# Patient Record
Sex: Female | Born: 1985 | Race: White | Hispanic: No | Marital: Married | Smoking: Never smoker
Health system: Southern US, Community
[De-identification: ages and names within clinical notes are randomized; demographics above are authoritative.]

## PROBLEM LIST (undated history)

## (undated) DIAGNOSIS — F4310 Post-traumatic stress disorder, unspecified: Secondary | ICD-10-CM

## (undated) DIAGNOSIS — N946 Dysmenorrhea, unspecified: Secondary | ICD-10-CM

## (undated) DIAGNOSIS — R87619 Unspecified abnormal cytological findings in specimens from cervix uteri: Secondary | ICD-10-CM

## (undated) DIAGNOSIS — F32A Depression, unspecified: Secondary | ICD-10-CM

## (undated) HISTORY — DX: Depression, unspecified: F32.A

## (undated) HISTORY — DX: Dysmenorrhea, unspecified: N94.6

## (undated) HISTORY — DX: Unspecified abnormal cytological findings in specimens from cervix uteri: R87.619

## (undated) HISTORY — DX: Post-traumatic stress disorder, unspecified: F43.10

---

## 2005-11-10 HISTORY — PX: WISDOM TOOTH EXTRACTION: SHX21

## 2006-11-10 DIAGNOSIS — R87619 Unspecified abnormal cytological findings in specimens from cervix uteri: Secondary | ICD-10-CM

## 2006-11-10 HISTORY — PX: COLPOSCOPY: SHX161

## 2006-11-10 HISTORY — DX: Unspecified abnormal cytological findings in specimens from cervix uteri: R87.619

## 2010-07-19 ENCOUNTER — Ambulatory Visit: Payer: Self-pay | Admitting: Internal Medicine

## 2010-07-19 DIAGNOSIS — J329 Chronic sinusitis, unspecified: Secondary | ICD-10-CM | POA: Insufficient documentation

## 2010-07-30 ENCOUNTER — Ambulatory Visit: Payer: Self-pay | Admitting: Family Medicine

## 2010-07-30 DIAGNOSIS — J209 Acute bronchitis, unspecified: Secondary | ICD-10-CM

## 2010-08-02 ENCOUNTER — Ambulatory Visit: Payer: Self-pay | Admitting: Family Medicine

## 2010-08-07 ENCOUNTER — Telehealth: Payer: Self-pay | Admitting: Internal Medicine

## 2010-10-18 ENCOUNTER — Telehealth: Payer: Self-pay | Admitting: Family Medicine

## 2010-12-10 NOTE — Letter (Signed)
Summary: [name]  All     ,     Phone:   Fax:           [name]    Ann Hall    Date TB Test Placed:  07/30/10  L or R forearm  TB Test Placed FA:[id number] Neale Bur[place], [place]  Lot #:  [id number]        Expiration Date:09/12/11  Date TB Test Read:  08/02/10  Result<5MM  TB Test Read by: Josph Macho, RMA

## 2010-12-10 NOTE — Progress Notes (Signed)
Summary: calling to see how she is doing  ---- Converted from flag ---- ---- 08/05/2010 9:52 PM, Madelin Headings MD wrote: contact her about how she is doing   to see if se is doing better .   If cough not improving  getting better consider getting chest x ray. ------------------------------  Left message on machine to call back. I tried to call pt but her number has been changed. Unable to leave a message for pt to call  back. Romualdo Bolk, CMA (AAMA)  August 12, 2010 10:38 AM

## 2010-12-10 NOTE — Assessment & Plan Note (Signed)
Summary: TB READING // RS  Nurse Visit   Allergies: No Known Drug Allergies  PPD Results    Date of reading: 08/02/2010    Results: < 5mm    Interpretation: negative

## 2010-12-10 NOTE — Assessment & Plan Note (Signed)
Summary: STILL COUGHING//CCM   Vital Signs:  Patient profile:   25 year old female Menstrual status:  regular Height:      69.5 inches (176.53 cm) Weight:      133.31 pounds (60.60 kg) O2 Sat:      99 % on Room air Temp:     98.2 degrees F (36.78 degrees C) oral Pulse rate:   69 / minute BP sitting:   108 / 78  (left arm) Cuff size:   regular  Vitals Entered By: Josph Macho RMA (July 30, 2010 10:01 AM)  O2 Flow:  Room air CC: Coughing X4 weeks/ CF Is Patient Diabetic? No   History of Present Illness: Patient is a 25 year old Caucasian female who is in today for evaluation of a persistent cough. 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 None of her friends or roommates are struggling with the same cough. She is never struggled with a cough in the past. She reports about 4 weeks ago she started having cough which was nonproductive worse in the morning and in the evenings and after exercise then about 2 weeks ago she began to have pressure behind her eyes sinus headaches nasal congestion. On September 9 she was seen here started on Augmentin she just initial course yesterday. The congestion and the dull ache and pressure behind the eyes are all gone the cough is improved but not resolved she is coughing less during the day of that she does cough frequently during the visit. She continues to cough on with exercise. The cough is nonproductive the cough is still affecting her sleep at night. She denies any fevers any chills, chest pain, shortness of breath except during her coughing fits. She denies any headache, ear pain, throat pain, palpitations, GI or GU complaints.  Current Medications (verified): 1)  Mucinex .Marland KitchenMarland KitchenMarland Kitchen 15ml At Bedtime  Allergies (verified): No Known Drug Allergies  Past History:  Past medical history reviewed for relevance to current acute and chronic problems. Social history (including risk factors) reviewed for  relevance to current acute and chronic problems.  Past Medical History: Reviewed history from 07/19/2010 and no changes required. Unremarkable varicella   primiparous hx of abn pap developmental counseling  Social History: Reviewed history from 07/19/2010 and no changes required. 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  Single  hhof 2   domestic partner  Never Smoked Alcohol use-yes-Social Drug use-no Regular exercise-yes ets as a child  originally from [place].  vegetarian  Review of Systems      See HPI  Physical Exam  General:  Well-developed,well-nourished,in no acute distress; alert,appropriate and cooperative throughout examination Head:  Normocephalic and atraumatic without obvious abnormalities. No apparent alopecia or balding. Eyes:  No corneal or conjunctival inflammation noted. EOMI. Perrla. Funduscopic exam benign, without hemorrhages, exudates or papilledema. Vision grossly normal. Ears:  External ear exam shows no significant lesions or deformities.  Otoscopic examination reveals clear canals, tympanic membranes are intact bilaterally without bulging, retraction, inflammation or discharge. Hearing is grossly normal bilaterally. Nose:  External nasal examination shows no deformity or inflammation. Nasal mucosa are pink and moist without lesions or exudates. Mouth:  Oral mucosa and oropharynx without lesions or exudates.  Teeth in good repair. Neck:  No deformities, masses, or tenderness noted. Lungs:  R wheezes and L wheezes at bases Heart:  Normal rate and regular rhythm. S1 and S2 normal without gallop, murmur, click, rub or other extra sounds. Abdomen:  Bowel  sounds positive,abdomen soft and non-tender without masses, organomegaly or hernias noted. Extremities:  No clubbing, cyanosis, edema, or deformity noted .   Cervical Nodes:  No lymphadenopathy noted Psych:  Cognition and judgment appear intact. Alert and cooperative with normal attention span and  concentration. No apparent delusions, illusions, hallucinations   Impression & Recommendations:  Problem # 1:  ACUTE BRONCHITIS (ICD-466.0)  Her updated medication list for this problem includes:    Zithromax 250 Mg Tabs (Azithromycin) .Marland Kitchen... 2 tabs by mouth once and then 1 tab by mouth once daily x 4 days    Proventil Hfa 108 (90 Base) Mcg/act Aers (Albuterol sulfate) .Marland Kitchen... 1-2 puffs by mouth q 4-6 hours as needed cough/wheeze/sob PPD placed due to persistent cough and her work at a homeless shelter and needs an annual PPD. Patient is concerned because an acquaintance once had similar symptoms as was found to have Mono. Due to her increased risk due to her work, will run a monospot  Complete Medication List: 1)  Mucinex  .Marland KitchenMarland KitchenMarland Kitchen 15ml at bedtime 2)  Zithromax 250 Mg Tabs (Azithromycin) .... 2 tabs by mouth once and then 1 tab by mouth once daily x 4 days 3)  Proventil Hfa 108 (90 Base) Mcg/act Aers (Albuterol sulfate) .Marland Kitchen.. 1-2 puffs by mouth q 4-6 hours as needed cough/wheeze/sob 4)  Align Caps (Probiotic product) .Marland Kitchen.. 1 cap by mouth qd  Other Orders: Monospot (44010) [name] [id number]) Admin 1st Vaccine (66440)  Patient Instructions: 1)  Please schedule a follow-up appointment as needed if symptoms worsen or do not improve 2)  Take your antibiotic as prescribed until ALL of it is gone, but stop if you develop a rash or swelling and contact our office as soon as possible.  3)  Acute Bronchitis symptoms for less then 10 days are not  helped by antibiotics. Take over the counter cough medications. Call if no improvement in 5-7 days, sooner if increasing cough, fever, or new symptoms ( shortness of breath, chest pain) .  4)  Need to return on Friday , 9/22 to have PPD read after 11am 5)  Use Proventil prior to exercise for next week and then as needed. 6)  Take probiotic such as Align caps daily for the next month and then as needed after that 7)  Consider adding Benefiber powder 2 tsp by  mouth once daily in 8 oz of fluids for h/o bowel irregularity Prescriptions: ALIGN  CAPS (PROBIOTIC PRODUCT) 1 cap by mouth qd  #7 x 0   Entered and Authorized by:   Danise Edge MD   Signed by:   Danise Edge MD on 07/30/2010   Method used:   Samples Given   RxID:   [id number] PROVENTIL HFA 108 (90 BASE) MCG/ACT AERS (ALBUTEROL SULFATE) 1-2 puffs by mouth q 4-6 hours as needed cough/wheeze/SOB  #1 x 0   Entered and Authorized by:   Danise Edge MD   Signed by:   Danise Edge MD on 07/30/2010   Method used:   Samples Given   RxID:   [id number] ZITHROMAX 250 MG TABS (AZITHROMYCIN) 2 tabs by mouth once and then 1 tab by mouth once daily x 4 days  #6 x 0   Entered and Authorized by:   Danise Edge MD   Signed by:   Danise Edge MD on 07/30/2010   Method used:   Print then Give to Patient   RxID:   [id number]    Immunizations Administered:  PPD Skin Test:  Vaccine Type: PPD    Site: right forearm    Mfr: Tubersol    Dose: 0.1 ml    Route: ID    Given by: Josph Macho RMA    Exp. Date: 09/12/2011    Lot #: [id number]

## 2010-12-10 NOTE — Assessment & Plan Note (Signed)
Summary: COUGH, CONGESTION, FEVER   Vital Signs:  Patient profile:   25 year old female Menstrual status:  regular LMP:     06/28/2010 Height:      69.5 inches Weight:      133 pounds BMI:     19.43 Temp:     98.2 degrees F oral Pulse rate:   72 / minute BP sitting:   110 / 70  (right arm) Cuff size:   regular  Vitals Entered By: Romualdo Bolk, CMA (AAMA) (July 19, 2010 2:51 PM) CC: New Pt- 3 weeks coughing, congestion, ha. Ears seem clogged. The symptoms comes and goes. Gaylyn Rong is a dull ha that is present about 75% of the time. LMP (date): 06/28/2010 LMP - Character: normal Menarche (age onset years): 13   Menses interval (days): 28 Menstrual flow (days): 6 Menstrual Status regular Enter LMP: 06/28/2010 Last PAP Result normal   History of Present Illness: Ann Hall comes in today  for  new patient t acute visit  today .   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   3 weeks of cough  and congestion like a head chest cold but has waxed and waned and now seems to be getting worse. .  no fever.  and no sob and no smoking  . nasla congestion  retroorbital HA and  midly productive cough. using oTC med some help but not getting better.    No cp sob .  No dx allergy or ets.    Preventive Care Screening  Last Tetanus Booster:    Date:  11/10/2008    Results:  Historical   Pap Smear:    Date:  11/11/2007    Results:  normal    Preventive Screening-Counseling & Management  Alcohol-Tobacco     Alcohol drinks/day: <1     Alcohol type: beer     Smoking Status: never  Caffeine-Diet-Exercise     Caffeine use/day: 0     Does Patient Exercise: yes  Safety-Violence-Falls     Seat Belt Use: yes     Firearms in the Home: no firearms in the home     Smoke Detectors: yes      Drug Use:  no.    Current Medications (verified): 1)  None  Allergies (verified): No Known Drug Allergies  Past History:  Past Medical  History: Unremarkable varicella   primiparous hx of abn pap developmental counseling  Past Surgical History: Denies surgical history  Past History:  Care Management: Gynecology: Margo Aye in [place] Couselor: Vira Browns in [place]  Family History: Father: High Cholesterol Mother: thyroid problems Siblings: Sister- thyroid problems, high cholesterol  Social History: Occupation:Student at [employer].  Single  hhof 2   domestic partner  Never Smoked Alcohol use-yes-Social Drug use-no Regular exercise-yes ets as a child  originally from [place].  vegetarian Smoking Status:  never Caffeine use/day:  0 Does Patient Exercise:  yes Occupation:  employed Drug Use:  no Seat Belt Use:  yes  Review of Systems       The patient complains of prolonged cough and headaches.  The patient denies anorexia, fever, weight loss, weight gain, vision loss, decreased hearing, hoarseness, chest pain, syncope, dyspnea on exertion, hemoptysis, abdominal pain, difficulty walking, depression, abnormal bleeding, and angioedema.    Physical Exam  General:  mildly congested in nad normal appearance and cooperative to examination.   Head:  normocephalic, atraumatic, and no abnormalities observed.  Eyes:  PERRL, EOMs full, conjunctiva clear  Ears:  R ear normal, L ear normal, and no external deformities.   Nose:  no external deformity and no external erythema.  boddy turbinates and mucoid dc  no focal tenderness Mouth:  mild  erythema  and cobblestonning no lesions Neck:  shoddy ac nodes no pc nodes  Lungs:  Normal respiratory effort, chest expands symmetrically. Lungs are clear to auscultation, no crackles or wheezes.no dullness.   Heart:  Normal rate and regular rhythm. S1 and S2 normal without gallop, murmur, click, rub or other extra sounds.no lifts.   Abdomen:  Bowel sounds positive,abdomen soft and non-tender without masses, organomegaly or   noted. Msk:  no joint swelling and no joint  warmth.   Pulses:  pulses intact without delay   Extremities:  no clubbing cyanosis or edema  Neurologic:  alert & oriented X3 and gait normal.  non focal grossly  Skin:  warts on right hand and fingers  Cervical Nodes:  No lymphadenopathy noted shoddy  Psych:  Oriented X3, normally interactive, good eye contact, and not anxious appearing.     Impression & Recommendations:  Problem # 1:  SINUSITIS (ICD-473.9) prolonged uri  complicated   Her updated medication list for this problem includes:    Amoxicillin-pot Clavulanate 875-125 Mg Tabs (Amoxicillin-pot clavulanate) .Marland Kitchen... 1 by mouth two times a day  Problem # 2:  COUGH (ICD-786.2) viral or from above  .      Expectant management  symptoms rx      Complete Medication List: 1)  Amoxicillin-pot Clavulanate 875-125 Mg Tabs (Amoxicillin-pot clavulanate) .Marland Kitchen.. 1 by mouth two times a day  Patient Instructions: 1)  expect improvement in 3-5 days    2)  call if not sig better in a week. or if has fever  or worse. 3)  try afrin type nose spray at night to see if relieves  the HA and congestion  . 4)  Saline    nose spray also helpful Prescriptions: AMOXICILLIN-POT CLAVULANATE 875-125 MG TABS (AMOXICILLIN-POT CLAVULANATE) 1 by mouth two times a day  #20 x 0   Entered and Authorized by:   Madelin Headings MD   Signed by:   Madelin Headings MD on 07/19/2010   Method used:   Print then Give to Patient   RxID:   [id number]

## 2010-12-12 NOTE — Progress Notes (Signed)
Summary: Chg from 07/30/10  Phone Note Call from Patient Call back at [facility] Phone [phone]   Summary of Call: Pt wanted to ck on chg from 9/20 visit, she feels she did not receive the service hetereophile antibodies [phone] for $17 Initial call taken by: Lannette Donath,  October 18, 2010 2:09 PM  Follow-up for Phone Call        I believe this is the TB test, please check with lab and notify patient Follow-up by: Danise Edge MD,  October 18, 2010 3:05 PM  Additional Follow-up for Phone Call Additional follow up Details #1::        This test was a monospot. Pt states she never had the mono test done. We need to get this charge off of pts bill please. Additional Follow-up by: Josph Macho RMA,  October 18, 2010 3:21 PM    Additional Follow-up for Phone Call Additional follow up Details #2::    Dr. 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     Follow-up by: Georga Bora,  October 29, 2010 9:08 AM

## 2011-06-03 ENCOUNTER — Encounter: Payer: Self-pay | Admitting: Family Medicine

## 2011-06-04 ENCOUNTER — Encounter: Payer: Self-pay | Admitting: Family Medicine

## 2011-11-17 ENCOUNTER — Ambulatory Visit: Payer: Self-pay

## 2014-07-01 ENCOUNTER — Emergency Department (HOSPITAL_COMMUNITY)
Admission: EM | Admit: 2014-07-01 | Discharge: 2014-07-01 | Disposition: A | Discharge status: Home / Self Care | Payer: No Typology Code available for payment source | Attending: Emergency Medicine | Admitting: Emergency Medicine

## 2014-07-01 ENCOUNTER — Emergency Department (HOSPITAL_COMMUNITY): Payer: No Typology Code available for payment source

## 2014-07-01 ENCOUNTER — Encounter (HOSPITAL_COMMUNITY): Payer: Self-pay | Admitting: Emergency Medicine

## 2014-07-01 DIAGNOSIS — W108XXA Fall (on) (from) other stairs and steps, initial encounter: Secondary | ICD-10-CM | POA: Diagnosis not present

## 2014-07-01 DIAGNOSIS — R404 Transient alteration of awareness: Secondary | ICD-10-CM | POA: Insufficient documentation

## 2014-07-01 DIAGNOSIS — S7000XA Contusion of unspecified hip, initial encounter: Secondary | ICD-10-CM | POA: Insufficient documentation

## 2014-07-01 DIAGNOSIS — S4980XA Other specified injuries of shoulder and upper arm, unspecified arm, initial encounter: Secondary | ICD-10-CM | POA: Insufficient documentation

## 2014-07-01 DIAGNOSIS — R42 Dizziness and giddiness: Secondary | ICD-10-CM | POA: Insufficient documentation

## 2014-07-01 DIAGNOSIS — S46909A Unspecified injury of unspecified muscle, fascia and tendon at shoulder and upper arm level, unspecified arm, initial encounter: Secondary | ICD-10-CM | POA: Insufficient documentation

## 2014-07-01 DIAGNOSIS — S0003XA Contusion of scalp, initial encounter: Secondary | ICD-10-CM | POA: Insufficient documentation

## 2014-07-01 DIAGNOSIS — S0083XA Contusion of other part of head, initial encounter: Secondary | ICD-10-CM | POA: Insufficient documentation

## 2014-07-01 DIAGNOSIS — Y92009 Unspecified place in unspecified non-institutional (private) residence as the place of occurrence of the external cause: Secondary | ICD-10-CM | POA: Diagnosis not present

## 2014-07-01 DIAGNOSIS — S40019A Contusion of unspecified shoulder, initial encounter: Secondary | ICD-10-CM | POA: Diagnosis not present

## 2014-07-01 DIAGNOSIS — S8000XA Contusion of unspecified knee, initial encounter: Secondary | ICD-10-CM | POA: Diagnosis not present

## 2014-07-01 DIAGNOSIS — R61 Generalized hyperhidrosis: Secondary | ICD-10-CM | POA: Insufficient documentation

## 2014-07-01 DIAGNOSIS — R55 Syncope and collapse: Secondary | ICD-10-CM

## 2014-07-01 DIAGNOSIS — Y9389 Activity, other specified: Secondary | ICD-10-CM | POA: Insufficient documentation

## 2014-07-01 DIAGNOSIS — S1093XA Contusion of unspecified part of neck, initial encounter: Secondary | ICD-10-CM

## 2014-07-01 LAB — CBC WITH DIFFERENTIAL/PLATELET
BASOS ABS: 0 10*3/uL (ref 0.0–0.1)
BASOS PCT: 0 % (ref 0–1)
EOS ABS: 0 10*3/uL (ref 0.0–0.7)
Eosinophils Relative: 0 % (ref 0–5)
HCT: 36.5 % (ref 36.0–46.0)
Hemoglobin: 12.2 g/dL (ref 12.0–15.0)
Lymphocytes Relative: 9 % — ABNORMAL LOW (ref 12–46)
Lymphs Abs: 0.6 10*3/uL — ABNORMAL LOW (ref 0.7–4.0)
MCH: 30.2 pg (ref 26.0–34.0)
MCHC: 33.4 g/dL (ref 30.0–36.0)
MCV: 90.3 fL (ref 78.0–100.0)
MONO ABS: 0.4 10*3/uL (ref 0.1–1.0)
Monocytes Relative: 7 % (ref 3–12)
NEUTROS ABS: 5.2 10*3/uL (ref 1.7–7.7)
Neutrophils Relative %: 84 % — ABNORMAL HIGH (ref 43–77)
Platelets: 142 10*3/uL — ABNORMAL LOW (ref 150–400)
RBC: 4.04 MIL/uL (ref 3.87–5.11)
RDW: 12.3 % (ref 11.5–15.5)
WBC: 6.2 10*3/uL (ref 4.0–10.5)

## 2014-07-01 LAB — BASIC METABOLIC PANEL
ANION GAP: 14 (ref 5–15)
BUN: 9 mg/dL (ref 6–23)
CHLORIDE: 106 meq/L (ref 96–112)
CO2: 24 mEq/L (ref 19–32)
Calcium: 9.5 mg/dL (ref 8.4–10.5)
Creatinine, Ser: 0.72 mg/dL (ref 0.50–1.10)
Glucose, Bld: 92 mg/dL (ref 70–99)
Potassium: 4.3 mEq/L (ref 3.7–5.3)
Sodium: 144 mEq/L (ref 137–147)

## 2014-07-01 MED ORDER — ACETAMINOPHEN 500 MG PO TABS
1000.0000 mg | ORAL_TABLET | Freq: Once | ORAL | Status: DC
  Filled 2014-07-01: qty 2

## 2014-07-01 NOTE — Discharge Instructions (Signed)
Syncope °Syncope is a medical term for fainting or passing out. This means you lose consciousness and drop to the ground. People are generally unconscious for less than 5 minutes. You may have some muscle twitches for up to 15 seconds before waking up and returning to normal. Syncope occurs more often in older adults, but it can happen to anyone. While most causes of syncope are not dangerous, syncope can be a sign of a serious medical problem. It is important to seek medical care.  °CAUSES  °Syncope is caused by a sudden drop in blood flow to the brain. The specific cause is often not determined. Factors that can bring on syncope include: °· Taking medicines that lower blood pressure. °· Sudden changes in posture, such as standing up quickly. °· Taking more medicine than prescribed. °· Standing in one place for too long. °· Seizure disorders. °· Dehydration and excessive exposure to heat. °· Low blood sugar (hypoglycemia). °· Straining to have a bowel movement. °· Heart disease, irregular heartbeat, or other circulatory problems. °· Fear, emotional distress, seeing blood, or severe pain. °SYMPTOMS  °Right before fainting, you may: °· Feel dizzy or light-headed. °· Feel nauseous. °· See all white or all black in your field of vision. °· Have cold, clammy skin. °DIAGNOSIS  °Your health care provider will ask about your symptoms, perform a physical exam, and perform an electrocardiogram (ECG) to record the electrical activity of your heart. Your health care provider may also perform other heart or blood tests to determine the cause of your syncope which may include: °· Transthoracic echocardiogram (TTE). During echocardiography, sound waves are used to evaluate how blood flows through your heart. °· Transesophageal echocardiogram (TEE). °· Cardiac monitoring. This allows your health care provider to monitor your heart rate and rhythm in real time. °· Holter monitor. This is a portable device that records your  heartbeat and can help diagnose heart arrhythmias. It allows your health care provider to track your heart activity for several days, if needed. °· Stress tests by exercise or by giving medicine that makes the heart beat faster. °TREATMENT  °In most cases, no treatment is needed. Depending on the cause of your syncope, your health care provider may recommend changing or stopping some of your medicines. °HOME CARE INSTRUCTIONS °· Have someone stay with you until you feel stable. °· Do not drive, use machinery, or play sports until your health care provider says it is okay. °· Keep all follow-up appointments as directed by your health care provider. °· Lie down right away if you start feeling like you might faint. Breathe deeply and steadily. Wait until all the symptoms have passed. °· Drink enough fluids to keep your urine clear or pale yellow. °· If you are taking blood pressure or heart medicine, get up slowly and take several minutes to sit and then stand. This can reduce dizziness. °SEEK IMMEDIATE MEDICAL CARE IF:  °· You have a severe headache. °· You have unusual pain in the chest, abdomen, or back. °· You are bleeding from your mouth or rectum, or you have black or tarry stool. °· You have an irregular or very fast heartbeat. °· You have pain with breathing. °· You have repeated fainting or seizure-like jerking during an episode. °· You faint when sitting or lying down. °· You have confusion. °· You have trouble walking. °· You have severe weakness. °· You have vision problems. °If you fainted, call your local emergency services (911 in [place] Do not drive   yourself to the hospital.  °MAKE SURE YOU: °· Understand these instructions. °· Will watch your condition. °· Will get help right away if you are not doing well or get worse. °Document Released: 10/27/2005 Document Revised: 11/01/2013 Document Reviewed: 12/26/2011 °Exit[employer]® Patient Information ©2015 [employer]. This information is not intended to replace  advice given to you by your health care provider. Make sure you discuss any questions you have with your health care provider. ° °

## 2014-07-01 NOTE — ED Notes (Signed)
Pt refuse to do POC urine preg, ED

## 2014-07-01 NOTE — ED Provider Notes (Signed)
CSN: [id number]     Arrival date & time 07/01/14  7829 History   First MD Initiated Contact with Patient 07/01/14 [phone]     Chief Complaint  Patient presents with  . Loss of Consciousness  . Fall    pt had a syncopal episode at the top of stairs and fell down steps. pt denies any hx of syncope     (Consider location/radiation/quality/duration/timing/severity/associated sxs/prior Treatment) HPI Comments: 28 yo female who passed out and fell down the stairs this morning. She reports waking up from sleep around 6:45am because she thought someone was in her house.  She jumped out of bed and ran to the top of the stairs.  She saw that the person downstairs was her domestic partner who she did not expect to be home at that time.  She began to feel lightheaded and nauseated, so she sat down at the top of the steps.  Her partner then saw her lose consciousness and pitch forward, tumbling down about 15 steps.  She remained unconscious for about 20 seconds prior to coming to.  She had some slurred speech for a short period of time.  She began hyperventilating and was moved over to their couch.  She then felt her hands cramp up for a short period of time.    She complains of right shoulder, hip, and knee pain.  She also struck her head multiple times on the way down . She has some tenderness to her right posterior head.    Patient is a 27 y.o. female presenting with syncope.  Loss of Consciousness Episode history:  Single Most recent episode:  Today Duration: less than a minute. Timing:  Constant Progression:  Resolved Chronicity:  New Context comment:  Jumping up out of bed Witnessed: yes   Relieved by:  Lying down Associated symptoms: diaphoresis, dizziness and nausea   Associated symptoms comment:  "hyperventilation"   History reviewed. No pertinent past medical history. History reviewed. No pertinent past surgical history. History reviewed. No pertinent family history. History  Substance  Use Topics  . Smoking status: Never Smoker   . Smokeless tobacco: Not on file  . Alcohol Use: No     Comment: 2 drinks a week   OB History   Grav Para Term Preterm Abortions TAB SAB Ect Mult Living                 Review of Systems  Constitutional: Positive for diaphoresis.  Cardiovascular: Positive for syncope.  Gastrointestinal: Positive for nausea.  Neurological: Positive for dizziness.  All other systems reviewed and are negative.     Allergies  Review of patient's allergies indicates no known allergies.  Home Medications   Prior to Admission medications   Not on File   BP 94/51  Pulse 56  Temp(Src) 97.5 F (36.4 C) (Oral)  Resp 18  Ht 5\' 10"  (1.778 m)  Wt 130 lb (58.968 kg)  BMI 18.65 kg/m2  SpO2 100%  LMP 06/17/2014 Physical Exam  Nursing note and vitals reviewed. Constitutional: She is oriented to person, place, and time. She appears well-developed and well-nourished. No distress.  HENT:  Head: Normocephalic and atraumatic. Head is without raccoon's eyes and without Battle's sign.    Nose: Nose normal.  Eyes: Conjunctivae and EOM are normal. Pupils are equal, round, and reactive to light. No scleral icterus.  Neck: Normal range of motion. No spinous process tenderness and no muscular tenderness present. Normal range of motion present.  Cardiovascular: Normal rate,  regular rhythm, normal heart sounds and intact distal pulses.   No murmur heard. Pulmonary/Chest: Effort normal and breath sounds normal. She has no rales. She exhibits no tenderness.  Abdominal: Soft. There is no tenderness. There is no rigidity, no rebound and no guarding.  Musculoskeletal: Normal range of motion. She exhibits no edema and no tenderness.       Thoracic back: She exhibits no tenderness and no bony tenderness.       Lumbar back: She exhibits no tenderness and no bony tenderness.  Contusion to right shoulder, right hip, and right knee.  Full ROM of all these joints with minimal  pain.    No evidence of trauma to extremities, except as noted.  2+ distal pulses.    Neurological: She is alert and oriented to person, place, and time.  Skin: Skin is warm and dry. No rash noted.  Psychiatric: She has a normal mood and affect.    ED Course  Procedures (including critical care time) Labs Review Labs Reviewed  CBC WITH DIFFERENTIAL  BASIC METABOLIC PANEL  POC URINE PREG, ED    Imaging Review Ct Head Wo Contrast  07/01/2014   CLINICAL DATA:  Neck pain, fall  EXAM: CT HEAD WITHOUT CONTRAST  CT CERVICAL SPINE WITHOUT CONTRAST  TECHNIQUE: Multidetector CT imaging of the head and cervical spine was performed following the standard protocol without intravenous contrast. Multiplanar CT image reconstructions of the cervical spine were also generated.  COMPARISON:  None.  FINDINGS: CT HEAD FINDINGS  No skull fracture is noted. Paranasal sinuses and mastoid air cells are unremarkable. No intracranial hemorrhage, mass effect or midline shift. No hydrocephalus. No acute infarction. No mass lesion is noted on this unenhanced scan. The gray and white-matter differentiation is preserved. No intra or extra-axial fluid collection.  CT CERVICAL SPINE FINDINGS  Axial images of the cervical spine shows no acute fracture or subluxation. Mild reversal of cervical lordosis. Alignment, disc spaces and vertebral heights are preserved. No prevertebral soft tissue swelling. Cervical airway is patent. No neural foramina narrowing. Spinal canal is patent. Computer processed images shows no acute fracture or subluxation.  IMPRESSION: 1. No acute intracranial abnormality. 2. No cervical spine acute fracture or subluxation.   Electronically Signed   By: Natasha MeadLiviu  Pop M.D.   On: 07/01/2014 09:59   Ct Cervical Spine Wo Contrast  07/01/2014   CLINICAL DATA:  Neck pain, fall  EXAM: CT HEAD WITHOUT CONTRAST  CT CERVICAL SPINE WITHOUT CONTRAST  TECHNIQUE: Multidetector CT imaging of the head and cervical spine was  performed following the standard protocol without intravenous contrast. Multiplanar CT image reconstructions of the cervical spine were also generated.  COMPARISON:  None.  FINDINGS: CT HEAD FINDINGS  No skull fracture is noted. Paranasal sinuses and mastoid air cells are unremarkable. No intracranial hemorrhage, mass effect or midline shift. No hydrocephalus. No acute infarction. No mass lesion is noted on this unenhanced scan. The gray and white-matter differentiation is preserved. No intra or extra-axial fluid collection.  CT CERVICAL SPINE FINDINGS  Axial images of the cervical spine shows no acute fracture or subluxation. Mild reversal of cervical lordosis. Alignment, disc spaces and vertebral heights are preserved. No prevertebral soft tissue swelling. Cervical airway is patent. No neural foramina narrowing. Spinal canal is patent. Computer processed images shows no acute fracture or subluxation.  IMPRESSION: 1. No acute intracranial abnormality. 2. No cervical spine acute fracture or subluxation.   Electronically Signed   By: Lanette HampshireLiviu  Pop M.D.  On: 07/01/2014 09:59  All radiology studies independently viewed by me.      EKG Interpretation   Date/Time:  Saturday July 01 2014 10:11:21 EDT Ventricular Rate:  52 PR Interval:  126 QRS Duration: 89 QT Interval:  449 QTC Calculation: 417 R Axis:   76 Text Interpretation:  Sinus rhythm Atrial premature complex RSR' in V1 or  V2, probably normal variant No old tracing to compare Confirmed by [facility]   MD, TREY ([id number]) on 07/01/2014 10:43:19 AM      MDM   Final diagnoses:  Vasovagal syncope  Fall down stairs, initial encounter    29 yo female with syncope and fall down stairs.  By history, syncope sounds vasovagal.  She has a few bruises, but have low suspicion for internal or bony injuries.  Plain films were offered and declined by patient.  EKG unremarkable.  Labs normal.  She declined pregnancy test, denying heterosexual sex.  She appears  stable for dc home.    Candyce Churn III, MD 07/01/14 1044

## 2014-07-01 NOTE — ED Notes (Signed)
Patient transported to X-ray 

## 2014-07-01 NOTE — ED Notes (Signed)
Declined W/C at D/C and was escorted to lobby by RN. 

## 2014-07-01 NOTE — ED Notes (Addendum)
Pt friend sts pt passed out at the top of stairs and fell down stairs and friend could not wake pt up right away. When pt woke she was noted to have slurred speech and fist were closed tight and could not be opened. Pt denies any hx of syncope or seizures. Pt does not remember the fall and sts she only remembers feeling really nauseated and breathing heavy and then waking up to friend telling her she fell down steps and asking her if she was ok. Pt sts she has small amount of pain in R shoulder and hip. Pt noted to have redness to R shoulder, hip and back. Pt sts neck is stiff and she has a knot on the back of her head.

## 2015-01-02 ENCOUNTER — Encounter: Payer: Self-pay | Admitting: Certified Nurse Midwife

## 2015-01-02 ENCOUNTER — Ambulatory Visit (INDEPENDENT_AMBULATORY_CARE_PROVIDER_SITE_OTHER): Payer: BLUE CROSS/BLUE SHIELD | Admitting: Certified Nurse Midwife

## 2015-01-02 ENCOUNTER — Telehealth: Payer: Self-pay | Admitting: Certified Nurse Midwife

## 2015-01-02 VITALS — BP 100/68 | HR 70 | Resp 16 | Ht 69.75 in | Wt 138.0 lb

## 2015-01-02 DIAGNOSIS — Z8349 Family history of other endocrine, nutritional and metabolic diseases: Secondary | ICD-10-CM

## 2015-01-02 DIAGNOSIS — Z0000 Encounter for general adult medical examination without abnormal findings: Secondary | ICD-10-CM

## 2015-01-02 DIAGNOSIS — Z01419 Encounter for gynecological examination (general) (routine) without abnormal findings: Secondary | ICD-10-CM

## 2015-01-02 DIAGNOSIS — R5382 Chronic fatigue, unspecified: Secondary | ICD-10-CM

## 2015-01-02 DIAGNOSIS — Z124 Encounter for screening for malignant neoplasm of cervix: Secondary | ICD-10-CM

## 2015-01-02 NOTE — Telephone Encounter (Signed)
Pt would like to know what tests are included in the thyroid panel she is scheduled to come in for tomorrow.

## 2015-01-02 NOTE — Progress Notes (Signed)
Encounter reviewed by Dr. Brook Silva.  

## 2015-01-02 NOTE — Progress Notes (Signed)
29 y.o. G0P0000 Married  Caucasian Fe here for annual exam. Periods normal, but with cramping day 1-2, without problems,duration 5-6 days with moderate bleeding. Sees Naturopath as PCP. Patient complaining of fatigue and has strong family history of hypothyroid, mother and sister, would like screening and lab screening. No other health issues today.  Patient's last menstrual period was 12/26/2014.          Sexually active: Yes.    The current method of family planning is none. Female partner.    Exercising: Yes.    walking & jogging Smoker:  no  Health Maintenance: Pap:  7/14 neg per patient Hx of abnormal 2008 pt believes with colposcopy yearly normal MMG:  none Colonoscopy:  none BMD:   none TDaP:  2010 Labs: none Self breast exam: done occ   reports that she has never smoked. She does not have any smokeless tobacco history on file. She reports that she drinks about 1.2 - 1.8 oz of alcohol per week. She reports that she does not use illicit drugs.  Past Medical History  Diagnosis Date  . Abnormal Pap smear of cervix 2008  . Dysmenorrhea     Past Surgical History  Procedure Laterality Date  . Colposcopy  2008    ? unsure of results    Current Outpatient Prescriptions  Medication Sig Dispense Refill  . B Complex Vitamins (B COMPLEX PO) Take by mouth daily.    . Multiple Vitamins-Minerals (MULTIVITAMIN PO) Take 1 tablet by mouth daily.     No current facility-administered medications for this visit.    Family History  Problem Relation Age of Onset  . Thyroid disease Mother   . Thyroid disease Sister   . Breast cancer Maternal Grandmother   . Stroke Maternal Grandmother   . Pancreatic cancer Maternal Grandfather   . Lymphoma Maternal Grandfather   . Heart attack Maternal Grandfather   . Heart attack Paternal Grandfather     ROS:  Pertinent items are noted in HPI.  Otherwise, a comprehensive ROS was negative.  Exam:   BP 100/68 mmHg  Pulse 70  Resp 16  Ht 5'  9.75" (1.772 m)  Wt 138 lb (62.596 kg)  BMI 19.94 kg/m2  LMP 12/26/2014 Height: 5' 9.75" (177.2 cm) Ht Readings from Last 3 Encounters:  01/02/15 5' 9.75" (1.772 m)  07/01/14  (1.778 m)  07/30/10 5' 9.5" (1.765 m)    General appearance: alert, cooperative and appears stated age Head: Normocephalic, without obvious abnormality, atraumatic Neck: no adenopathy, supple, symmetrical, trachea midline and thyroid normal to inspection and palpation Lungs: clear to auscultation bilaterally Breasts: normal appearance, no masses or tenderness, No nipple retraction or dimpling, No nipple discharge or bleeding, No axillary or supraclavicular adenopathy Heart: regular rate and rhythm Abdomen: soft, non-tender; no masses,  no organomegaly Extremities: extremities normal, atraumatic, no cyanosis or edema Skin: Skin color, texture, turgor normal. No rashes or lesions Lymph nodes: Cervical, supraclavicular, and axillary nodes normal. No abnormal inguinal nodes palpated Neurologic: Grossly normal   Pelvic: External genitalia:  no lesions              Urethra:  normal appearing urethra with no masses, tenderness or lesions              Bartholin's and Skene's: normal                 Vagina: normal appearing vagina with normal color and discharge, no lesions  Cervix: normal, non tender, no lesions              Pap taken: Yes.   Bimanual Exam:  Uterus:  normal size, contour, position, consistency, mobility, non-tender              Adnexa: normal adnexa and no mass, fullness, tenderness               Rectovaginal: Confirms               Anus:  normal sphincter tone, no lesions  Chaperone present: Yes  A:  Well Woman with normal exam  Contraception not needed Female relationship  Screening labs, fasting  History of abnormal pap smear with colpo and follow up pap only  Chronic fatigue  Strong family history of hypothyroid, mother, sister  P:   Reviewed health and wellness  pertinent to exam  Labs: CMP,TSH with panel,anti TPO, IBC, ferritin,lipid panel, CBC with diff, Vitamin D, patient will schedule appointment and come in fasting  Discussed diet and sleep history to help with fatigue.  Seeing MD for evaluation  Pap smear taken today with HPVHR   counseled on breast self exam, adequate intake of calcium and vitamin D, diet and exercise  return annually or prn  An After Visit Summary was printed and given to the patient.

## 2015-01-02 NOTE — Patient Instructions (Signed)
EXERCISE AND DIET:  We recommended that you start or continue a regular exercise program for good health. Regular exercise means any activity that makes your heart beat faster and makes you sweat.  We recommend exercising at least 30 minutes per day at least 3 days a week, preferably 4 or 5.  We also recommend a diet low in fat and sugar.  Inactivity, poor dietary choices and obesity can cause diabetes, heart attack, stroke, and kidney damage, among others.    ALCOHOL AND SMOKING:  Women should limit their alcohol intake to no more than 7 drinks/beers/glasses of wine (combined, not each!) per week. Moderation of alcohol intake to this level decreases your risk of breast cancer and liver damage. And of course, no recreational drugs are part of a healthy lifestyle.  And absolutely no smoking or even second hand smoke. Most people know smoking can cause heart and lung diseases, but did you know it also contributes to weakening of your bones? Aging of your skin?  Yellowing of your teeth and nails?  CALCIUM AND VITAMIN D:  Adequate intake of calcium and Vitamin D are recommended.  The recommendations for exact amounts of these supplements seem to change often, but generally speaking 600 mg of calcium (either carbonate or citrate) and 800 units of Vitamin D per day seems prudent. Certain women may benefit from higher intake of Vitamin D.  If you are among these women, your doctor will have told you during your visit.    PAP SMEARS:  Pap smears, to check for cervical cancer or precancers,  have traditionally been done yearly, although recent scientific advances have shown that most women can have pap smears less often.  However, every woman still should have a physical exam from her gynecologist every year. It will include a breast check, inspection of the vulva and vagina to check for abnormal growths or skin changes, a visual exam of the cervix, and then an exam to evaluate the size and shape of the uterus and  ovaries.  And after 31 years of age, a rectal exam is indicated to check for rectal cancers. We will also provide age appropriate advice regarding health maintenance, like when you should have certain vaccines, screening for sexually transmitted diseases, bone density testing, colonoscopy, mammograms, etc.   MAMMOGRAMS:  All women over 40 years old should have a yearly mammogram. Many facilities now offer a "3D" mammogram, which may cost around $50 extra out of pocket. If possible,  we recommend you accept the option to have the 3D mammogram performed.  It both reduces the number of women who will be called back for extra views which then turn out to be normal, and it is better than the routine mammogram at detecting truly abnormal areas.    COLONOSCOPY:  Colonoscopy to screen for colon cancer is recommended for all women at age 50.  We know, you hate the idea of the prep.  We agree, BUT, having colon cancer and not knowing it is worse!!  Colon cancer so often starts as a polyp that can be seen and removed at colonscopy, which can quite literally save your life!  And if your first colonoscopy is normal and you have no family history of colon cancer, most women don't have to have it again for 10 years.  Once every ten years, you can do something that may end up saving your life, right?  We will be happy to help you get it scheduled when you are ready.    Be sure to check your insurance coverage so you understand how much it will cost.  It may be covered as a preventative service at no cost, but you should check your particular policy.     Hypothyroidism The thyroid is a large gland located in the lower front of your neck. The thyroid gland helps control metabolism. Metabolism is how your body handles food. It controls metabolism with the hormone thyroxine. When this gland is underactive (hypothyroid), it produces too little hormone.  CAUSES These include:   Absence or destruction of thyroid  tissue.  Goiter due to iodine deficiency.  Goiter due to medications.  Congenital defects (since birth).  Problems with the pituitary. This causes a lack of TSH (thyroid stimulating hormone). This hormone tells the thyroid to turn out more hormone. SYMPTOMS  Lethargy (feeling as though you have no energy)  Cold intolerance  Weight gain (in spite of normal food intake)  Dry skin  Coarse hair  Menstrual irregularity (if severe, may lead to infertility)  Slowing of thought processes Cardiac problems are also caused by insufficient amounts of thyroid hormone. Hypothyroidism in the newborn is cretinism, and is an extreme form. It is important that this form be treated adequately and immediately or it will lead rapidly to retarded physical and mental development. DIAGNOSIS  To prove hypothyroidism, your caregiver may do blood tests and ultrasound tests. Sometimes the signs are hidden. It may be necessary for your caregiver to watch this illness with blood tests either before or after diagnosis and treatment. TREATMENT  Low levels of thyroid hormone are increased by using synthetic thyroid hormone. This is a safe, effective treatment. It usually takes about four weeks to gain the full effects of the medication. After you have the full effect of the medication, it will generally take another four weeks for problems to leave. Your caregiver may start you on low doses. If you have had heart problems the dose may be gradually increased. It is generally not an emergency to get rapidly to normal. HOME CARE INSTRUCTIONS   Take your medications as your caregiver suggests. Let your caregiver know of any medications you are taking or start taking. Your caregiver will help you with dosage schedules.  As your condition improves, your dosage needs may increase. It will be necessary to have continuing blood tests as suggested by your caregiver.  Report all suspected medication side effects to your  caregiver. SEEK MEDICAL CARE IF: Seek medical care if you develop:  Sweating.  Tremulousness (tremors).  Anxiety.  Rapid weight loss.  Heat intolerance.  Emotional swings.  Diarrhea.  Weakness. SEEK IMMEDIATE MEDICAL CARE IF:  You develop chest pain, an irregular heart beat (palpitations), or a rapid heart beat. MAKE SURE YOU:   Understand these instructions.  Will watch your condition.  Will get help right away if you are not doing well or get worse. Document Released: 10/27/2005 Document Revised: 01/19/2012 Document Reviewed: 06/16/2008 [facility] Patient Information 2015 [place], [place]. This information is not intended to replace advice given to you by your health care provider. Make sure you discuss any questions you have with your health care provider.

## 2015-01-02 NOTE — Telephone Encounter (Signed)
Left message to call Ann Hall at [phone]. 

## 2015-01-03 ENCOUNTER — Other Ambulatory Visit (INDEPENDENT_AMBULATORY_CARE_PROVIDER_SITE_OTHER): Payer: BLUE CROSS/BLUE SHIELD

## 2015-01-03 DIAGNOSIS — Z8349 Family history of other endocrine, nutritional and metabolic diseases: Secondary | ICD-10-CM

## 2015-01-03 DIAGNOSIS — R6889 Other general symptoms and signs: Secondary | ICD-10-CM

## 2015-01-03 DIAGNOSIS — Z0000 Encounter for general adult medical examination without abnormal findings: Secondary | ICD-10-CM

## 2015-01-03 LAB — CBC WITH DIFFERENTIAL/PLATELET
Basophils Absolute: 0 10*3/uL (ref 0.0–0.1)
Basophils Relative: 0 % (ref 0–1)
EOS ABS: 0.1 10*3/uL (ref 0.0–0.7)
Eosinophils Relative: 2 % (ref 0–5)
HEMATOCRIT: 40.6 % (ref 36.0–46.0)
Hemoglobin: 14.1 g/dL (ref 12.0–15.0)
Lymphocytes Relative: 23 % (ref 12–46)
Lymphs Abs: 0.8 10*3/uL (ref 0.7–4.0)
MCH: 32 pg (ref 26.0–34.0)
MCHC: 34.7 g/dL (ref 30.0–36.0)
MCV: 92.3 fL (ref 78.0–100.0)
MONOS PCT: 8 % (ref 3–12)
MPV: 10.4 fL (ref 8.6–12.4)
Monocytes Absolute: 0.3 10*3/uL (ref 0.1–1.0)
Neutro Abs: 2.4 10*3/uL (ref 1.7–7.7)
Neutrophils Relative %: 67 % (ref 43–77)
Platelets: 191 10*3/uL (ref 150–400)
RBC: 4.4 MIL/uL (ref 3.87–5.11)
RDW: 12.9 % (ref 11.5–15.5)
WBC: 3.6 10*3/uL — ABNORMAL LOW (ref 4.0–10.5)

## 2015-01-03 LAB — FERRITIN: Ferritin: 59 ng/mL (ref 10–291)

## 2015-01-03 LAB — HEMOGLOBIN A1C
Hgb A1c MFr Bld: 5 % (ref <5.7)
MEAN PLASMA GLUCOSE: 97 mg/dL (ref <117)

## 2015-01-04 LAB — COMPREHENSIVE METABOLIC PANEL
ALBUMIN: 4.6 g/dL (ref 3.5–5.2)
ALT: 12 U/L (ref 0–35)
AST: 19 U/L (ref 0–37)
Alkaline Phosphatase: 77 U/L (ref 39–117)
BUN: 7 mg/dL (ref 6–23)
CALCIUM: 9.4 mg/dL (ref 8.4–10.5)
CO2: 25 mEq/L (ref 19–32)
Chloride: 105 mEq/L (ref 96–112)
Creat: 0.79 mg/dL (ref 0.50–1.10)
Glucose, Bld: 78 mg/dL (ref 70–99)
POTASSIUM: 4.4 meq/L (ref 3.5–5.3)
Sodium: 140 mEq/L (ref 135–145)
Total Bilirubin: 0.6 mg/dL (ref 0.2–1.2)
Total Protein: 7.6 g/dL (ref 6.0–8.3)

## 2015-01-04 LAB — IBC PANEL
%SAT: 30 % (ref 20–55)
TIBC: 308 ug/dL (ref 250–470)
UIBC: 217 ug/dL (ref 125–400)

## 2015-01-04 LAB — IRON: Iron: 91 ug/dL (ref 42–145)

## 2015-01-04 LAB — LIPID PANEL
CHOL/HDL RATIO: 1.9 ratio
Cholesterol: 128 mg/dL (ref 0–200)
HDL: 66 mg/dL (ref >=46)
LDL CALC: 51 mg/dL (ref 0–99)
Triglycerides: 56 mg/dL (ref <150)
VLDL: 11 mg/dL (ref 0–40)

## 2015-01-04 LAB — THYROID PANEL WITH TSH
FREE THYROXINE INDEX: 2 (ref 1.4–3.8)
T3 UPTAKE: 28 % (ref 22–35)
T4 TOTAL: 7.1 ug/dL (ref 4.5–12.0)
TSH: 1.576 u[IU]/mL (ref 0.350–4.500)

## 2015-01-04 LAB — THYROID PEROXIDASE ANTIBODY: Thyroperoxidase Ab SerPl-aCnc: 2 IU/mL (ref <9)

## 2015-01-04 LAB — VITAMIN D 25 HYDROXY (VIT D DEFICIENCY, FRACTURES): VIT D 25 HYDROXY: 33 ng/mL (ref 30–100)

## 2015-01-04 NOTE — Addendum Note (Signed)
Addended by: Verner CholLEONARD, DEBORAH S on: 01/04/2015 04:42 PM   Modules accepted: Orders

## 2015-01-05 ENCOUNTER — Telehealth: Payer: Self-pay

## 2015-01-05 LAB — IPS PAP TEST WITH HPV

## 2015-01-05 NOTE — Telephone Encounter (Signed)
Patient notified of results as written by provider 

## 2015-01-05 NOTE — Telephone Encounter (Signed)
[email or web address]

## 2015-01-05 NOTE — Telephone Encounter (Signed)
Pt returning call

## 2015-01-05 NOTE — Telephone Encounter (Signed)
-----   Message from Verner Choleborah S Leonard, CNM sent at 01/04/2015  4:41 PM EST ----- Notify patient that her Thyroid panel with TSH and TPO antibody is normal Liver, kidney, glucose profile is normal Cholesterol panel looks great Vitamin D is low normal start on OTC 1000 IU daily Vitamin D 3 Hgb.A 1-c is normal Iron,  Iron profile panel, Ferritin are all  Normal CBC is normal except for low WBC, which can be due to viral infection, needs repeat CBC in 3 weeks order in

## 2015-01-08 NOTE — Telephone Encounter (Signed)
Patient was seen for lab appointment on 2/24. Patient has been advised of results as seen below by Cornelia CopaJoy Johnson, CMA.  Notes Recorded by Araceli Boucheavina Johnson, CMA on 01/05/2015 at 2:11 PM Patient notified of results as written by provider. Pt states she is unable to schedule 3wk lab appt today. Pt states she will callback. Pt is aware of importance of having this recheck to make sure it is normal. Pt agrees. Notes Recorded by Araceli Boucheavina Johnson, CMA on 01/05/2015 at 9:35 AM Left message for patient to callback Notes Recorded by Verner Choleborah S Leonard, CNM on 01/04/2015 at 4:41 PM Notify patient that her Thyroid panel with TSH and TPO antibody is normal Liver, kidney, glucose profile is normal Cholesterol panel looks great Vitamin D is low normal start on OTC 1000 IU daily Vitamin D 3 Hgb.A 1-c is normal Iron, Iron profile panel, Ferritin are all Normal CBC is normal except for low WBC, which can be due to viral infection, needs repeat CBC in 3 weeks order in  Routing to provider for final review. Patient agreeable to disposition. Will close encounter

## 2017-07-14 ENCOUNTER — Ambulatory Visit (INDEPENDENT_AMBULATORY_CARE_PROVIDER_SITE_OTHER): Payer: Managed Care, Other (non HMO) | Admitting: Family Medicine

## 2017-07-14 DIAGNOSIS — M76891 Other specified enthesopathies of right lower limb, excluding foot: Secondary | ICD-10-CM

## 2017-07-14 DIAGNOSIS — M722 Plantar fascial fibromatosis: Secondary | ICD-10-CM | POA: Diagnosis not present

## 2017-07-14 MED ORDER — MELOXICAM 15 MG PO TABS: 15.0000 mg | ORAL_TABLET | Freq: Every day | ORAL | 1 refills | Status: DC

## 2017-07-14 NOTE — Patient Instructions (Signed)
meloxicam once a day WITH FOOD for 3-4 weeks   Hips/pelvis: Rear leg raise--watch symmetry 15-30 twice a day Add lateral hip raise if you want Proprioception for ankle--60 seconds 2-3 times a day  Hip proprioception and strengthening as per handout Great to meet you See you in 3-4 weeks at your convenience Get some spongy new shoes!

## 2017-07-15 ENCOUNTER — Encounter: Payer: Self-pay | Admitting: Family Medicine

## 2017-07-15 DIAGNOSIS — S76019A Strain of muscle, fascia and tendon of unspecified hip, initial encounter: Secondary | ICD-10-CM | POA: Insufficient documentation

## 2017-07-15 DIAGNOSIS — M76891 Other specified enthesopathies of right lower limb, excluding foot: Secondary | ICD-10-CM | POA: Insufficient documentation

## 2017-07-15 DIAGNOSIS — M722 Plantar fascial fibromatosis: Secondary | ICD-10-CM | POA: Insufficient documentation

## 2017-07-15 NOTE — Assessment & Plan Note (Signed)
Hip ABductor weakness on right, unsure of etiology. There does not appear to be any component of SI joint dysfunction. Strengthening w ABduction exercises, also add standing single stance leg rotations and single stance ankle balance.rtc 3-4 weeks

## 2017-07-15 NOTE — Assessment & Plan Note (Signed)
Discussed options. She has already tried rest and rolling with icing. I see no specific abnormality of gait. 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 She would like to try NSAIDS as she tries to avoid medication ifpossible and has not done thisyet. I suspect she would do well with nitroglycerin patch therapy and she will consider that. Also consider injection at some point of not improving but I would hold that for last. She will f/u 3 weeks or so.

## 2017-07-15 NOTE — Progress Notes (Signed)
    CHIEF COMPLAINT / HPI:   1. Right foot pain / plantar fasciitis: has had pain for 2-3 months. Totally stopped running for 6 weeks,started back with one day a week and pain is back. Pain worst in AM. Hurts after and during a run. No new shoes or training regimen. No specific injury. No change in training  environment, no new weight gain.  2. Right sided low back and buttock pain for last several months to year(s). Was initially intermittent but has become chronic. Feels like her right leg "turns in" when she does a lot of standing and at times will have buttock pain during and after running. Buttock pain 3-4/10 at its worst. Non radiating. No numbness or weakness in lower extrremity but "hip feels weak". No falls.  Feels like whole right lower side from pelvis down to foot is 'weak'.   Avid runner, most days of the week. Alternates 3-4 pairs of various shoes. Treadmil and outdoors. 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 Also swims and does other sports and weights.  REVIEW OF SYSTEMS:  See HPI. Additionally pertinent ROS: negative for fever, unusual weight change, rash, sleep problems, depressive symptoms.  PERTINENT  PMH / PSH: I have reviewed the patient's medications, allergies, past medical and surgical history, smoking status.  Pertinent findings that relate to today's visit / issues include: Prior fx vs some type of strain of  RLE many years ago, lower leg (cast or fx walker boot).  Non smoker No personal hx DM FH hypothyroidism   OBJECTIVE:  Vital signs are reviewed.  WD WN NAD slender BACK: no unusual curvature.FROM and painless in flexion / extension and lateral bending. Normal trendelenburg.  HIPS: FROM IR/ER painless. Normal hip flexor strength bilaterally symmetrical RIGHT ABductors much weaker than LEFT. Adduction symmetrical. Quad strength, bulk and tone symmetrical and normal. KNEES: FROM ligamentously intact 5/5 strength flexion and extenson.Normal FADIR/ FABER ANKLES  stable bilaterally with good end point. Plantar and dorsiflexion 5/5 symmetrical FEET: ttp plantar fascia origin right. Mild forefoot varus, arches retained. . Single stance balance / proprioception significantly worse on right vs left. GAIT: walking; normal stride length. Not antalgic. Running: Upright upper body posture. Relatively short stride length, midfoot to forefoot striker. Mild crossover on right. A little ankle wobble  During stance phase, particularly on right  ASSESSMENT / PLAN: Please see problem oriented charting for details Greater than 50% of our 40 minute office visit was spent in counseling and education regarding  Her foot pain and sense of right lower extremity / pelvis / hip weakness.. We also extensively discussed treatment options, side effects, long term sequelae of non treatment, return to activity and alternative training strategies.

## 2017-07-19 NOTE — Progress Notes (Deleted)
[facility] [address], [address] [place], [place] [address] [phone] [phone] [phone]  Date:  07/22/2017   Name:  Ann Hall Vancleve   DOB:  1986-05-10   MRN:  [id number]  PCP:  Pearline Cablesopland, Hollan Philipp C, MD    Chief Complaint: No chief complaint on file.   History of Present Illness:  Ann Hall Mayorquin is a 31 y.o. very pleasant female patient who presents with the following:  Here today as a new patient to establish care Little in Epic- a sports med visit and GYN visit from a couple of year ago  Patient Active Problem List   Diagnosis Date Noted  . Hip abductor tendinitis . wekaness, right 07/15/2017  . Plantar fasciitis of right foot 07/15/2017    Past Medical History:  Diagnosis Date  . Abnormal Pap smear of cervix 2008  . Dysmenorrhea     Past Surgical History:  Procedure Laterality Date  . COLPOSCOPY  2008   ? unsure of results  . WISDOM TOOTH EXTRACTION Bilateral 2007    Social History  Substance Use Topics  . Smoking status: Never Smoker  . Smokeless tobacco: Never Used  . Alcohol use 1.2 - 1.8 oz/week    2 - 3 Standard drinks or equivalent per week    Family History  Problem Relation Age of Onset  . Thyroid disease Mother   . Thyroid disease Sister   . Breast cancer Maternal Grandmother [id number]  . Stroke Maternal Grandmother   . Pancreatic cancer Maternal Grandfather   . Lymphoma Maternal Grandfather   . Heart attack Maternal Grandfather   . Heart attack Paternal Grandfather     No Known Allergies  Medication list has been reviewed and updated.  Current Outpatient Prescriptions on File Prior to Visit  Medication Sig Dispense Refill  . B Complex Vitamins (B COMPLEX PO) Take by mouth daily.    . meloxicam (MOBIC) 15 MG tablet Take 1 tablet (15 mg total) by mouth daily. 30 tablet 1  . Multiple Vitamins-Minerals (MULTIVITAMIN PO) Take 1 tablet by mouth daily.     No current facility-administered medications on file prior to  visit.     Review of Systems:  As per HPI- otherwise negative.   Physical Examination: There were no vitals filed for this visit. There were no vitals filed for this visit. There is no height or weight on file to calculate BMI. Ideal Body Weight:    GEN: WDWN, NAD, Non-toxic, A & O x 3 HEENT: Atraumatic, Normocephalic. Neck supple. No masses, No LAD. Ears and Nose: No external deformity. CV: RRR, No M/G/R. No JVD. No thrill. No extra heart sounds. PULM: CTA B, no wheezes, crackles, rhonchi. No retractions. No resp. distress. No accessory muscle use. ABD: S, NT, ND, +BS. No rebound. No HSM. EXTR: No c/c/e NEURO Normal gait.  PSYCH: Normally interactive. Conversant. Not depressed or anxious appearing.  Calm demeanor.    Assessment and Plan: ***  Signed Abbe AmsterdamJessica Namish Krise, MD

## 2017-07-20 ENCOUNTER — Telehealth: Payer: Self-pay | Admitting: Behavioral Health

## 2017-07-20 NOTE — Telephone Encounter (Signed)
Patient rescheduled appointment for 09/17/17 with Dr. Patsy Lageropland.

## 2017-07-22 ENCOUNTER — Ambulatory Visit: Payer: Self-pay | Admitting: Family Medicine

## 2017-07-31 ENCOUNTER — Ambulatory Visit (INDEPENDENT_AMBULATORY_CARE_PROVIDER_SITE_OTHER): Payer: Managed Care, Other (non HMO) | Admitting: Family Medicine

## 2017-07-31 VITALS — BP 104/60 | Ht 70.0 in | Wt 140.0 lb

## 2017-07-31 DIAGNOSIS — M76891 Other specified enthesopathies of right lower limb, excluding foot: Secondary | ICD-10-CM

## 2017-07-31 DIAGNOSIS — M722 Plantar fascial fibromatosis: Secondary | ICD-10-CM | POA: Diagnosis not present

## 2017-07-31 MED ORDER — NITROGLYCERIN 0.2 MG/HR TD PT24: MEDICATED_PATCH | TRANSDERMAL | 1 refills | Status: DC

## 2017-07-31 NOTE — Patient Instructions (Addendum)
Cut patch into one - fourth pieces Place a one fourth piece of patch on  skin over affected area, changing to a new piece every 24 hours.   You may experience a headache during the first 1-2 days and maybe up to 2 weeks of using the patch; these should improve and go away. If you experience headaches after beginning nitroglycerin patch treatment, you may take your preferred over the counter pain medicine such as tylenol or advil as directed on the box. Another side effect of the nitroglycerin patch can be skin irritation  or rash related to the patch adhesive.   Please notify our office if you develop  severe headaches or rash, and stop the patch.  Please call our office with any questions or problems. Tendon healing with nitroglycerin patch may require 12 to 24 weeks depending on the extent of injury. Men should not use if taking Viagra, Cialis, or Levitra as it may cause an unsafe lowering of the blood pressure..  Use with caution if you have migraines or rosacea.  Try the new exercises with a focus on proprioception to try and train your knee-brain pathway. Try the lunges with a shorter step.  I will be out of the office the next two weeks but if anything comes up one of my partners will be glad to see you.

## 2017-07-31 NOTE — Progress Notes (Signed)
CHIEF COMPLAINT / HPI:   31 year old female who is being seen for follow-up of plantar fasciitis and right sided knee and hip pain.  For her plantar fasciitis, this is been an issue since June of this . She has tried conservative measures with using anti-inflammatories, frozen water bottle, and decreased running. She states that her pain went from very significant 10 out of 10 to now a  3 out of 10 in nature. She has gone for a run 2 days ago for approximately 3 miles and it did not cause her significant pain. When she is having pain, she localizes this to the medial heel. 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 She has yet to get new shoes because she is afraid it will mask the overlying issue. Denies any left foot weakness, numbness or tingling. States she is no longer getting pain in the early morning or stiffness. She has been doing stretches.  For her right knee and hip issues, she states that her pain is mildly worse. She thinks exercises are aggravating her knee issues.She developed anterior medial knee pain while running [place] marker to out of her 3 mile run. She states that she continues to have lateral hip tightness. She has been performing her abductor strengthening exercises however the proprioception exercises have been too painful (symptoms at the knee) to perform. These exercises have been being performed for approximately one week. States that squatting exercises makes her pain worse. Denies any numbness or tingling in LE.  REVIEW OF SYSTEMS:  see history of present illness. Patient denies any new symptoms of fevers or chills.  PERTINENT  PMH / PSH: I have reviewed the patient's medications, allergies, past medical and surgical history, smoking status and updated in the EMR as appropriate.   OBJECTIVE: Gen.: Well-appearing Musculoskeletal:  Right lower extremity: No obvious abnormalities upon inspection.Positive for mild  tenderness to palpation over the pes anserine medially as well as tenderness and tightness palpated over the entire course of the right IT band, no greater trochanter tenderness.  HIPSatient has full range of motion in hip flexion and extension . Patient is able to abduct and adduct her hip. Strength testing: Hip abduction 4 out of 5, hip adduction 5 out of 5 however pain elicited distally at its attachment, hip flexion 5 out of 5, hip extension 5 out of 5. . Negative log roll test. Negative pelvic tilt.  KNEES:Negative Faber test. Negative cross leg test. Negative Lachman. Negative McMurray testing. Negative varus valgus stress.  NEURO:Sensation is intact to light touch L4-S1. Bilaterally VASC: Dorsalis pedis pulse +2  bilaterally.    foot/ ankle: No obvious abnormality on inspection. Tenderness to palpation over the medial calcaneus on the plantar surface. No significant tenderness distally over the plantar fascia. Patient has full range of motion in ankle dorsiflexion and plantarflexion. +5 out of 5 in dorsiflexion, +5 out of 5 in ankle plantar flexion. Negative ankle inversion test. Negative anterior drawer of the ankle. Sensation is intact to light touch L4-S1. Dorsalis pedis pulse +2 out of 4 bilaterally.  Neurologic: DTR 2+ B= knee and ankle  ASSESSMENT / PLAN: 1. Mild sensatoin of right knee instability with increase in knee symptomatology with hip ABductor HEP. She reports prior history of 3 or 4 right lower extremity injuries, all in the foot and ankle area, all requiring casts.  I wonder if she has some ingrained sense that the right leg is not stable.  We'll change her form for right leg lunges, shortening or stridor.  See if we can get her more confident in the right lower extremity.  Modified her for receptive exercises so that she will have assistance for balance, thus not straining her knee or ankle on the right.  We'll continue to have her do hip abduction exercises, either lying down  with elastic band or on gymnasium equipment that she will feel stable. #2.  Plantar fasciitis: Unclear exactly how much she is improved over time.  We again discussed at length nitroglycerin patch therapy and I gave her the handout, a prescription, and we discussed in detail.  She is going to decide if she wants to pursue that or not.  I will follow-up with her in 3-4 weeks, sooner with problems.

## 2017-09-15 NOTE — Progress Notes (Addendum)
[facility] [address], [address] [place], [place] [address] [phone]  Date:  09/17/2017   Name:  Ann Hall   DOB:  [birth year]   MRN:  [id number]  PCP:  Pearline Cables, MD    Chief Complaint: Annual Exam (Pt would like CPE today, declined PAP and flu vaccine at this time.)   History of Present Illness:  Ann Hall is a 31 y.o. 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   She was ready for some easier work-  She notes that working at [employer] is easy and not stressful She likes to run for exercise, 10- 25 miles per week depending on her race schedule She is seeing PT for her hip and has been off running for about 10 days   She used to see a naturopath in [place],but otherwise does not really see doctors She uses a fiber supplement now- psyillium husk. No other meds Never had any surgery  Besides running she enjoys cooking, reading, playing with her dogs-they have 3 She is married to Eckley  She had just oatmeal for breakfast- we will do labs for her today She did have a pap in 2016- it was normal. She would like to wait and do this next year as she is not due yet  In 2008 she had an abnl pap- they did a follow- up pap/ colpo and all was ok  Her GM had breast cancer at age 105, mother and sister have hypothyroidism- however they are both thin Thin funs in her family  She had a tetanus in 2010 She declines a flu shot today  She is mostly concerned about her menstrual cycles. She notes that her cycles have lengthened to 36 days recently, her PMS sx seem to be increased and she will have more cramping and pain the first 2 days of her period.  However, she is not eager to get on any medication for this as she  worries abut SE  Patient Active Problem List   Diagnosis Date Noted  . Hip abductor tendinitis . wekaness, right 07/15/2017  . Plantar fasciitis of right foot 07/15/2017    Past Medical History:  Diagnosis Date  . Abnormal Pap smear of cervix 2008  . Dysmenorrhea     Past Surgical History:  Procedure Laterality Date  . COLPOSCOPY  2008   ? unsure of results  . WISDOM TOOTH EXTRACTION Bilateral 2007    Social History   Tobacco Use  . Smoking status: Never Smoker  . Smokeless tobacco: Never Used  Substance Use Topics  . Alcohol use: Yes    Alcohol/week: 1.2 - 1.8 oz    Types: 2 - 3 Standard drinks or equivalent per week  . Drug use: No    Family History  Problem Relation Age of Onset  . Thyroid disease Mother   . Thyroid disease Sister   . Breast cancer Maternal Grandmother 51  . Stroke Maternal Grandmother   . Pancreatic cancer Maternal Grandfather   . Lymphoma Maternal Grandfather   . Heart attack Maternal Grandfather   . Heart attack Paternal Grandfather     No Known Allergies  Medication list has been reviewed and updated.  No current  outpatient medications on file prior to visit.   No current facility-administered medications on file prior to visit.     Review of Systems:  As per HPI- otherwise negative. No fever or chills Denies trying to lose weight and in fact would like to gain She is also concerned that she may be at high risk for skin cancer due to her fair complexion    Physical Examination: Vitals:   09/17/17 1023  BP: 110/80  Pulse: 74  Temp: 98.1 F (36.7 C)  SpO2: 99%   Vitals:   09/17/17 1023  Weight: 132 lb 3.2 oz (60 kg)  Height: 5' 10.5" (1.791 m)   Body mass index is 18.7 kg/m. Ideal Body Weight: Weight in (lb) to have BMI = 25: 176.4  GEN: WDWN, NAD, Non-toxic, A & O x 3, thin build, tall, borderline underweight..  Masculine presentation  HEENT: Atraumatic, Normocephalic. Neck supple. No masses, No LAD. Ears and  Nose: No external deformity. CV: RRR, No M/G/R. No JVD. No thrill. No extra heart sounds. PULM: CTA B, no wheezes, crackles, rhonchi. No retractions. No resp. distress. No accessory muscle use. ABD: S, NT, ND, +BS. No rebound. No HSM. EXTR: No c/c/e NEURO Normal gait.  PSYCH: Normally interactive. Conversant. Not depressed or anxious appearing.  Calm demeanor.  Breast: normal exam, no masses/ dimpling/ discharge   Assessment and Plan: Screening for thyroid disorder - Plan: TSH  Abnormal menses - Plan: TSH  Screening for hyperlipidemia - Plan: Lipid panel  Screening for diabetes mellitus - Plan: Comprehensive metabolic panel, Hemoglobin A1c  Screening for deficiency anemia - Plan: CBC  Concern about skin cancer without diagnosis  Here today to establish care as a new patient Discussed her concerns She has pale skin and red hair- I did encourage her to establish care with a dermatologist for skin checks Labs pending as above- will be in touch with her results Encouraged her to try and gain some weight- this may help to regulate her menstrual cycles Will plan further follow- up pending labs.   Signed Abbe AmsterdamJessica Nickalas Mccarrick, MD  Received labs and sent message to pt  Results for orders placed or performed in visit on 09/17/17  CBC  Result Value Ref Range   WBC 3.6 (L) 4.0 - 10.5 K/uL   RBC 4.10 3.87 - 5.11 Mil/uL   Platelets 195.0 150.0 - 400.0 K/uL   Hemoglobin 13.2 12.0 - 15.0 g/dL   HCT 16.139.4 09.636.0 - 04.546.0 %   MCV 96.0 78.0 - 100.0 fl   MCHC 33.5 30.0 - 36.0 g/dL   RDW 40.912.9 81.111.5 - 91.415.5 %  Comprehensive metabolic panel  Result Value Ref Range   Sodium 139 135 - 145 mEq/L   Potassium 4.1 3.5 - 5.1 mEq/L   Chloride 106 96 - 112 mEq/L   CO2 29 19 - 32 mEq/L   Glucose, Bld 86 70 - 99 mg/dL   BUN 12 6 - 23 mg/dL   Creatinine, Ser 7.820.77 0.40 - 1.20 mg/dL   Total Bilirubin 0.8 0.2 - 1.2 mg/dL   Alkaline Phosphatase 73 39 - 117 U/L   AST 18 0 - 37 U/L   ALT 12 0 - 35 U/L   Total  Protein 7.4 6.0 - 8.3 g/dL   Albumin 4.2 3.5 - 5.2 g/dL   Calcium 9.4 8.4 - 95.610.5 mg/dL   GFR 21.3091.55 >86.57>60.00 mL/min  Hemoglobin A1c  Result Value Ref Range   Hgb A1c MFr Bld 5.2 4.6 - 6.5 %  Lipid panel  Result Value Ref Range   Cholesterol 118 0 - 200 mg/dL   Triglycerides 16.175.0 0.0 - 149.0 mg/dL   HDL 09.6058.30 >45.40>39.00 mg/dL   VLDL 98.115.0 0.0 - 19.140.0 mg/dL   LDL Cholesterol 44 0 - 99 mg/dL   Total CHOL/HDL Ratio 2    NonHDL 59.35   TSH  Result Value Ref Range   TSH 1.04 0.35 - 4.50 uIU/mL

## 2017-09-17 ENCOUNTER — Encounter: Payer: Self-pay | Admitting: Family Medicine

## 2017-09-17 ENCOUNTER — Ambulatory Visit: Payer: Managed Care, Other (non HMO) | Admitting: Family Medicine

## 2017-09-17 VITALS — BP 110/80 | HR 74 | Temp 98.1°F | Ht 70.5 in | Wt 132.2 lb

## 2017-09-17 DIAGNOSIS — Z1329 Encounter for screening for other suspected endocrine disorder: Secondary | ICD-10-CM | POA: Diagnosis not present

## 2017-09-17 DIAGNOSIS — Z711 Person with feared health complaint in whom no diagnosis is made: Secondary | ICD-10-CM

## 2017-09-17 DIAGNOSIS — Z13220 Encounter for screening for lipoid disorders: Secondary | ICD-10-CM

## 2017-09-17 DIAGNOSIS — Z131 Encounter for screening for diabetes mellitus: Secondary | ICD-10-CM | POA: Diagnosis not present

## 2017-09-17 DIAGNOSIS — N926 Irregular menstruation, unspecified: Secondary | ICD-10-CM

## 2017-09-17 DIAGNOSIS — Z130 Encounter for screening for diseases of the blood and blood-forming organs and certain disorders involving the immune mechanism: Secondary | ICD-10-CM | POA: Diagnosis not present

## 2017-09-17 LAB — COMPREHENSIVE METABOLIC PANEL
ALK PHOS: 73 U/L (ref 39–117)
ALT: 12 U/L (ref 0–35)
AST: 18 U/L (ref 0–37)
Albumin: 4.2 g/dL (ref 3.5–5.2)
BUN: 12 mg/dL (ref 6–23)
CO2: 29 meq/L (ref 19–32)
Calcium: 9.4 mg/dL (ref 8.4–10.5)
Chloride: 106 mEq/L (ref 96–112)
Creatinine, Ser: 0.77 mg/dL (ref 0.40–1.20)
GFR: 91.55 mL/min (ref >60.00)
GLUCOSE: 86 mg/dL (ref 70–99)
POTASSIUM: 4.1 meq/L (ref 3.5–5.1)
Sodium: 139 mEq/L (ref 135–145)
TOTAL PROTEIN: 7.4 g/dL (ref 6.0–8.3)
Total Bilirubin: 0.8 mg/dL (ref 0.2–1.2)

## 2017-09-17 LAB — LIPID PANEL
CHOL/HDL RATIO: 2
Cholesterol: 118 mg/dL (ref 0–200)
HDL: 58.3 mg/dL (ref >39.00)
LDL Cholesterol: 44 mg/dL (ref 0–99)
NonHDL: 59.35
TRIGLYCERIDES: 75 mg/dL (ref 0.0–149.0)
VLDL: 15 mg/dL (ref 0.0–40.0)

## 2017-09-17 LAB — CBC
HEMATOCRIT: 39.4 % (ref 36.0–46.0)
Hemoglobin: 13.2 g/dL (ref 12.0–15.0)
MCHC: 33.5 g/dL (ref 30.0–36.0)
MCV: 96 fl (ref 78.0–100.0)
PLATELETS: 195 10*3/uL (ref 150.0–400.0)
RBC: 4.1 Mil/uL (ref 3.87–5.11)
RDW: 12.9 % (ref 11.5–15.5)
WBC: 3.6 10*3/uL — ABNORMAL LOW (ref 4.0–10.5)

## 2017-09-17 LAB — HEMOGLOBIN A1C: Hgb A1c MFr Bld: 5.2 % (ref 4.6–6.5)

## 2017-09-17 LAB — TSH: TSH: 1.04 u[IU]/mL (ref 0.35–4.50)

## 2017-09-17 NOTE — Patient Instructions (Addendum)
It was nice to meet you today!    I will be in touch with your labs asap I wonder if gaining a few lbs- and giving your body more energy reserves- may help your menstrual cycles become more regular.  However, failing this a birth control pill would also help.  We can follow-up about this  For dermatology, [facility] often [facility], Dr. Bufford ButtnerSusan Stinehelfer- however they have many good doctors there.  I would recommend that you have a skin check due to your very fair complexion  Address: [address], [place], [place] [address]   Phone: [phone]

## 2017-12-11 ENCOUNTER — Ambulatory Visit: Payer: Self-pay

## 2017-12-11 ENCOUNTER — Encounter: Payer: Self-pay | Admitting: Sports Medicine

## 2017-12-11 ENCOUNTER — Ambulatory Visit: Payer: 59 | Admitting: Sports Medicine

## 2017-12-11 VITALS — BP 102/64 | HR 62 | Ht 70.5 in | Wt 139.8 lb

## 2017-12-11 DIAGNOSIS — G5792 Unspecified mononeuropathy of left lower limb: Secondary | ICD-10-CM | POA: Insufficient documentation

## 2017-12-11 DIAGNOSIS — M722 Plantar fascial fibromatosis: Secondary | ICD-10-CM | POA: Diagnosis not present

## 2017-12-11 MED ORDER — DICLOFENAC SODIUM 2 % TD SOLN: 1.0000 "application " | Freq: Two times a day (BID) | TRANSDERMAL | 2 refills | Status: DC

## 2017-12-11 MED ORDER — GABAPENTIN 100 MG PO CAPS: ORAL_CAPSULE | ORAL | 1 refills | Status: DC

## 2017-12-11 MED ORDER — DICLOFENAC SODIUM 2 % TD SOLN: 1.0000 "application " | Freq: Two times a day (BID) | TRANSDERMAL | 0 refills | Status: AC

## 2017-12-11 NOTE — Assessment & Plan Note (Signed)
Fairly classic appearing plantar fasciitis on the left with marked swelling at this area but the majority of her symptoms actually are coming from the neuritis type picture she is having.  We will have her continue with classic plantar fascial treatment techniques but I do think the reason is been so recalcitrant is due to the abnormal nerve function.  Please see below.

## 2017-12-11 NOTE — Assessment & Plan Note (Signed)
We will plan to go and start her on gabapentin for both diagnostic and therapeutic purposes.  We will have her continue working with ankle intrinsic strengthening and actually had a compression sleeve which may be beneficial for decreasing the swelling and possible subluxation.  Additionally would like for her to try taking Pennsaid using Pennsaid twice daily for the next 4 weeks until I see her back if she does not seem to tolerate this well will consider transition to systemic NSAID.

## 2017-12-11 NOTE — Procedures (Signed)
LIMITED MSK ULTRASOUND OF left ankle: Images were obtained and interpreted by myself, Gaspar BiddingMichael Foday Cone, DO  Images have been saved and stored to PACS system. Images obtained on: GE S7 Ultrasound machine  FINDINGS:   Plantar fascia has fusiform swelling at its origin measuring 0.65cm  Normal-appearing longitudinal arch  She has prominence of the tibial nerve as it transverses posteriorly to the medial malleolus there is a small amount of fusiform swelling.  Questionable slight subluxation with inversion and eversion but it is incomplete.  Small amount of surrounding soft tissue edema  IMPRESSION:  1. Plantar fasciitis 2. 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

## 2017-12-11 NOTE — Progress Notes (Signed)
Veverly FellsMichael D. Delorise Shinerigby, DO  [facility] [place] [phone]  Ann Corporalmanda Krell - 32 y.o. female MRN [id number]  Date of birth: 15-Aug-1986  Visit Date: 12/11/2017  PCP: Pearline Cablesopland, Jessica C, MD   Referred by: Pearline Cablesopland, Jessica C, MD   Scribe for today's visit: Christoper FabianMolly Weber, LAT, ATC     SUBJECTIVE:  Ann Hall is here for New Patient (Initial Visit) (L plantar fasciitis) .  Referred by: Dr. Cloria SpringBuccola Her L plantar fascia symptoms INITIALLY: Began in June 2018 w/ no MOI.  Pt is a runner but wasn't increasing mileage.  Has prior hx of R plantar fascia. Described as a 7/10 at it's worst and is described as a terrible bruise in her heel and a 2/10 at rest, radiating to the L plantar foot. Worsened with prolonged weight bearing and running. Improved with rest/non-weightbearing Additional associated symptoms include: notes some N/T in her B lower legs and feet    At this time symptoms show no change compared to onset  She has been using Hoka shoes, has been to PT, used Meloxicam with no noticeable relief.  Saw Dr. Jennette KettleNeal and tried meloxicam x 4 weeks.  Has intermittently stopped running and currently hasn't run since Oct 2018.  Saw Ilene QuaAlan Buccola for PT x 5 visits.  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 Denies fevers, chills, or night sweats. Denies unexplained weight loss. Denies personal history of cancer. Denies changes in bowel or bladder habits. Denies recent unreported falls. Denies new or worsening dyspnea or wheezing. Denies headaches or dizziness.  Reports numbness, tingling or weakness  In the extremities - B lower legs and feet Denies dizziness or presyncopal episodes Denies lower extremity edema     HISTORY & PERTINENT PRIOR DATA:  Prior History reviewed and updated per electronic medical record.  Significant history, findings, studies and interim changes include:  reports that  has never smoked.  she has never used smokeless tobacco. Recent Labs    09/17/17 1104  HGBA1C 5.2   No specialty comments available. Problem  Neuritis of Ankle, Left  Plantar Fasciitis of Left Foot    OBJECTIVE:  VS:  HT:5' 10.5" (179.1 cm)   WT:139 lb 12.8 oz (63.4 kg)  BMI:19.77    BP:102/64  HR:62bpm  TEMP: ( )  RESP:98 %   PHYSICAL EXAM: Constitutional: WDWN, Non-toxic appearing. Psychiatric: Alert & appropriately interactive.  Not depressed or anxious appearing. Respiratory: No increased work of breathing.  Trachea Midline Eyes: Pupils are equal.  EOM intact without nystagmus.  No scleral icterus  NEUROVASCULAR exam: No clubbing or cyanosis appreciated No significant venous stasis changes Capillary Refill: normal, less than 2 seconds   Bilateral feet have markedly high cavus feet that are flexible.  She has moderate pain over the plantar aspect of the left foot as well as the longitudinal arch.  Mild pain over the navicular but minimal bony tenderness and more so over the posterior tibialis tendon.  She has pain with Tinel's at the tarsal tunnel but this is mild.  It does cause some radicular symptoms but not reproducible.  She has good plantar flexion dorsiflexion strength.  DP and PT pulses are 2+/4.  Dorsiflexion range of motion to 110 degrees.   No additional findings.   ASSESSMENT & PLAN:   1. Plantar fasciitis of left foot   2. Neuritis of ankle, left    PLAN:    Plantar fasciitis of left  foot Fairly classic appearing plantar fasciitis on the left with marked swelling at this area but the majority of her symptoms actually are coming from the neuritis type picture she is having.  We will have her continue with classic plantar fascial treatment techniques but I do think the reason is been so recalcitrant is due to the abnormal nerve function.  Please see below.  Neuritis of ankle, left We will plan to go and start her on gabapentin for both diagnostic and therapeutic purposes.   We will have her continue working with ankle intrinsic strengthening and actually had a compression sleeve which may be beneficial for decreasing the swelling and possible subluxation.  Additionally would like for her to try taking Pennsaid using Pennsaid twice daily for the next 4 weeks until I see her back if she does not seem to tolerate this well will consider transition to systemic NSAID.   ++++++++++++++++++++++++++++++++++++++++++++ Orders & Meds: Orders Placed This Encounter  Procedures  . [place] MSK POCT ULTRASOUND    Meds ordered this encounter  Medications  . gabapentin (NEURONTIN) 100 MG capsule    Sig: Start with 1 tab po qhs X 1 week, then increase to 1 tab po bid X 1 week then 1 tab po tid prn    Dispense:  90 capsule    Refill:  1  . Diclofenac Sodium (PENNSAID) 2 % SOLN    Sig: Place 1 application onto the skin 2 (two) times daily for 1 day.    Dispense:  8 g    Refill:  0  . Diclofenac Sodium (PENNSAID) 2 % SOLN    Sig: Place 1 application onto the skin 2 (two) times daily.    Dispense:  112 g    Refill:  2    Home Phone      [phone] Mobile          [phone]     ++++++++++++++++++++++++++++++++++++++++++++ Follow-up: Return in about 4 weeks (around 01/08/2018).   Pertinent documentation may be included in additional procedure notes, imaging studies, problem based documentation and patient instructions. Please see these sections of the encounter for additional information regarding this visit. CMA/ATC served as Neurosurgeon during this visit. History, Physical, and Plan performed by medical provider. Documentation and orders reviewed and attested to.      Andrena Mews, DO    [facility]ician

## 2017-12-11 NOTE — Patient Instructions (Signed)
I recommend you obtained a compression sleeve to help with your joint problems. There are many options on the market however I recommend obtaining a L ankle Body Helix compression sleeve.  You can find information (including how to appropriate measure yourself for sizing) can be found at www.Body Helix.com.  Many of these products are health savings account (HSA) eligible.   You can use the compression sleeve at any time throughout the day but is most important to use while being active as well as for 2 hours post-activity.   It is appropriate to ice following activity with the compression sleeve in place.   

## 2017-12-15 ENCOUNTER — Telehealth: Payer: Self-pay | Admitting: *Deleted

## 2017-12-15 NOTE — Telephone Encounter (Signed)
Dr Rigby, please advise.  

## 2017-12-15 NOTE — Telephone Encounter (Signed)
Copied from CRM [id number]. Topic: General - Other >> Dec 15, 2017  3:28 PM Gerrianne ScalePayne, Angela L wrote: Reason for CRM: pt had saw Dr Berline Choughigby and he gave her gabapentin (NEURONTIN) 100 MG capsule and now she has had constipation since she started it she was seen on 12-11-17

## 2017-12-16 ENCOUNTER — Telehealth: Payer: Self-pay | Admitting: Sports Medicine

## 2017-12-16 NOTE — Telephone Encounter (Signed)
Copied from CRM [phone]. Topic: Quick Communication - See Telephone Encounter >> Dec 16, 2017 12:57 PM Rudi CocoLathan, Walida Cajas M, NT wrote: CRM for notification. See Telephone encounter for:   12/16/17. Pt. Calling back to speak with Dr. Berline Choughigby about compression sleeve and also about reaction to med. Pt. Can be reached at [phone]

## 2017-12-16 NOTE — Telephone Encounter (Signed)
Please advise 

## 2017-12-16 NOTE — Telephone Encounter (Signed)
Spoke to pt and she had concerns about several side effects she was noticing (skin itching/irritation w/ Pennsaid and inability to reach orgasm and constipation w/ the Gabapentin) as well as to let us know that her ankle compression sleeve was too tight after wearing it for 6-7 hours.  I spoke to Dr. Berline Choughigby and relayed to the pt that she should con't w/ the 100 mg qd dose of Gabapentin and that the side effects she is noticing should resolve w/i the next 1-2 weeks.  He states that he does not want her to use the Pennsaid as long as her skin integrity is not normal but once her scratch/scab heals, she can resume the Pennsaid being careful not to rub her skin too much while applying the Pennsaid.  Pt is also advised to return her ankle compression sleeve and exchange it for the next size up.  Pt states that she plans to come in on Friday afternoon to pick up her larger size (size M) ankle compression sleeve.  Pt also notes that overall her plantar fascia symptoms have been doing better since initiating the medication and wearing the compression sleeve so she's glad to know that she doesn't have to stop taking any of the medicine.

## 2017-12-17 NOTE — Telephone Encounter (Signed)
[facility].  Concerns have been addressed

## 2017-12-21 ENCOUNTER — Encounter: Payer: Self-pay | Admitting: Sports Medicine

## 2018-01-08 ENCOUNTER — Encounter: Payer: Self-pay | Admitting: Sports Medicine

## 2018-01-08 ENCOUNTER — Ambulatory Visit: Payer: 59 | Admitting: Sports Medicine

## 2018-01-08 VITALS — BP 102/64 | HR 56 | Ht 70.5 in | Wt 141.4 lb

## 2018-01-08 DIAGNOSIS — G5792 Unspecified mononeuropathy of left lower limb: Secondary | ICD-10-CM | POA: Diagnosis not present

## 2018-01-08 DIAGNOSIS — M722 Plantar fascial fibromatosis: Secondary | ICD-10-CM

## 2018-01-08 NOTE — Patient Instructions (Signed)
Look into having your insurance company cover a set of custom orthotics.  The code is L3030 and there are 2 units.  You can call them  and ask if this is covered.  I am happy to do these for you at any time, you just need to let our front office schedulers know you would like an "orthotic appointment."  

## 2018-01-08 NOTE — Assessment & Plan Note (Signed)
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  She does have a positive Tinel's over the cubital tunnel.  Given the duration of her symptoms I would like to have her come back next week for custom cushion orthotics as this should provide her a moderate degree of improvement in her biomechanics and hopefully alleviate some of the discomfort while addressing the underlying root cause.  We did discuss we could consider trying to do a nerve block/injection of steroids but this has limited benefit and technically may not provide us the relief we are looking for.  I would like to see her try to titrate her gabapentin upwards and the side effects she was previously having have significantly improved.  Continue efforts and exercises and compression as well as Pennsaid.

## 2018-01-08 NOTE — Assessment & Plan Note (Signed)
Pregnancy ultrasound did reveal that she has a moderate degree of classical plantar fasciitis with swelling at the origin of the plantar fascia.  She have some improvement in the amount of pain that she is having in this area but is still present.  We will plan to have her continue with allergen exercises as well as try to address the underlying structural issues that are likely contributing to the neuritis as well as overload of the plantar fascia.  At the lack of improvement in persistent plantar fascial origin pain we did discuss briefly that there is an option for referral for Tenex however I am concerned at this time that she would have only incomplete relief of her symptoms.

## 2018-01-08 NOTE — Progress Notes (Signed)
Ann FellsMichael D. Hall Shinerigby, DO  [facility] [place] [phone]  Ann Hall Coldren - 32 y.o. female MRN [id number]  Date of birth: 03-Jul-1986  Visit Date: 01/08/2018  PCP: Pearline Cablesopland, Jessica C, MD   Referred by: Pearline Cablesopland, Jessica C, MD   Scribe for today's visit: Stevenson ClinchBrandy Coleman, CMA     SUBJECTIVE:  Ann Hall Szczerba is here for Follow-up (L foot pain)  12/11/17: Her L plantar fascia symptoms INITIALLY: Began in June 2018 w/ no MOI.  Pt is a runner but wasn't increasing mileage.  Has prior hx of R plantar fascia. Described as a 7/10 at it's worst and is described as a terrible bruise in her heel and a 2/10 at rest, radiating to the L plantar foot. Worsened with prolonged weight bearing and running. Improved with rest/non-weightbearing Additional associated symptoms include: notes some N/T in her B lower legs and feet At this time symptoms show no change compared to onset  She has been using Hoka shoes, has been to PT, used Meloxicam with no noticeable relief. Saw Dr. Jennette KettleNeal and tried meloxicam x 4 weeks.  Has intermittently stopped running and currently hasn't run since Oct 2018.  Saw Ilene QuaAlan Buccola for PT x 5 visits. 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  Current symptoms are moderate & are nonradiating but has noticing numbness and tingling in bilateral lower legs. She denies increased warmth, erythema, or swelling in LEs.  She has been taking Gabapentin, sometimes once daily and other times twice daily. She had an infected open wound so she was unable to use Pennsaid but she has started using it over the past week, she gets minimal relief with this. She has been wearing Body Helix compression sleeve daily with minimal change in sx.    ROS Denies night time disturbances. Reports fevers, chills, or night  sweats. Denies unexplained weight loss. Denies personal history of cancer. Reports changes in bowel or bladder habits, constipation while taking Gabapentin. Denies recent unreported falls. Denies new or worsening dyspnea or wheezing. Denies headaches or dizziness.  Reports numbness, tingling or weakness  In the extremities.  Denies dizziness or presyncopal episodes Denies lower extremity edema    HISTORY & PERTINENT PRIOR DATA:  Prior History reviewed and updated per electronic medical record.  Significant history, findings, studies and interim changes include:  reports that  has never smoked. she has never used smokeless tobacco. Recent Labs    09/17/17 1104  HGBA1C 5.2   No specialty comments available. Problem  Neuritis of Ankle, Left  Plantar Fasciitis of Left Foot    OBJECTIVE:  VS:  HT:5' 10.5" (179.1 cm)   WT:141 lb 6.4 oz (64.1 kg)  BMI:20    BP:102/64  HR:(!) 56bpm  TEMP: ( )  RESP:98 %   PHYSICAL EXAM: Constitutional: WDWN, Non-toxic appearing. Psychiatric: Alert & appropriately interactive.  Not depressed or anxious appearing. Respiratory: No increased work of breathing.  Trachea Midline Eyes: Pupils are equal.  EOM intact without nystagmus.  No scleral icterus  NEUROVASCULAR exam: No clubbing or cyanosis appreciated No significant venous stasis changes Capillary Refill: normal, less than 2 seconds   Left foot is well aligned with a moderately high cavus arch at rest that slightly collapse of the weightbearing but is minimal.  Intrinsic ankle strength is 5+/5.  She has good dorsiflexion to  105 degrees.  She does have pain with Tinel's over the cubital tunnel that does cause radiation into her foot as well as into the plantar aspect of her heel.  Palpation of the plantar fascial origin she does have a moderate degree of pain with deep palpation but is slightly improved compared to the past.  DP and PT pulses are 2+/4.  Negative straight leg  raise.   ASSESSMENT & PLAN:   1. Plantar fasciitis of left foot   2. Neuritis of ankle, left    ++++++++++++++++++++++++++++++++++++++++++++ Orders & Meds: No orders of the defined types were placed in this encounter.  No orders of the defined types were placed in this encounter.   ++++++++++++++++++++++++++++++++++++++++++++ PLAN:    Plantar fasciitis of left foot Pregnancy ultrasound did reveal that she has a moderate degree of classical plantar fasciitis with swelling at the origin of the plantar fascia.  She have some improvement in the amount of pain that she is having in this area but is still present.  We will plan to have her continue with allergen exercises as well as try to address the underlying structural issues that are likely contributing to the neuritis as well as overload of the plantar fascia.  At the lack of improvement in persistent plantar fascial origin pain we did discuss briefly that there is an option for referral for Tenex however I am concerned at this time that she would have only incomplete relief of her symptoms.  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  She does have a positive Tinel's over the cubital tunnel.  Given the duration of her symptoms I would like to have her come back next week for custom cushion orthotics as this should provide her a moderate degree of improvement in her biomechanics and hopefully alleviate some of the discomfort while addressing the underlying root cause.  We did discuss we could consider trying to do a nerve block/injection of steroids but this has limited benefit and technically may not provide [place] the relief we are looking for.  I would like to see her try to titrate her gabapentin upwards and the side effects she was previously having have significantly improved.  Continue efforts and exercises and compression as well as  Pennsaid.   Follow-up: Return in about 6 weeks (around 02/19/2018) for repeat diagnostic ultrasound, and orthotics as needed.   Pertinent documentation may be included in additional procedure notes, imaging studies, problem based documentation and patient instructions. Please see these sections of the encounter for additional information regarding this visit. CMA/ATC served as Neurosurgeon during this visit. History, Physical, and Plan performed by medical provider. Documentation and orders reviewed and attested to.      Andrena Mews, DO    [facility] Physician

## 2018-01-10 ENCOUNTER — Encounter: Payer: Self-pay | Admitting: Sports Medicine

## 2018-01-13 ENCOUNTER — Ambulatory Visit (INDEPENDENT_AMBULATORY_CARE_PROVIDER_SITE_OTHER): Payer: 59 | Admitting: Sports Medicine

## 2018-01-13 ENCOUNTER — Encounter: Payer: Self-pay | Admitting: Sports Medicine

## 2018-01-13 VITALS — BP 110/70 | HR 57 | Ht 70.5 in | Wt 141.0 lb

## 2018-01-13 DIAGNOSIS — R269 Unspecified abnormalities of gait and mobility: Secondary | ICD-10-CM | POA: Diagnosis not present

## 2018-01-13 DIAGNOSIS — M216X1 Other acquired deformities of right foot: Secondary | ICD-10-CM

## 2018-01-13 DIAGNOSIS — M722 Plantar fascial fibromatosis: Secondary | ICD-10-CM | POA: Diagnosis not present

## 2018-01-13 DIAGNOSIS — G5792 Unspecified mononeuropathy of left lower limb: Secondary | ICD-10-CM

## 2018-01-13 NOTE — Procedures (Signed)
PROCEDURE: CUSTOM ORTHOTIC FABRICATION Patient's underlying musculoskeletal conditions are directly related to poor biomechanics and will benefit from a functional custom orthotic.  There are no significant foot deformities that complicate the use of a custom orthotic.  The patient was fitted for a standard, cushioned, semi-rigid orthotic. The orthotic was heated & placed on the orthotic stand. The patient was positioned in subtalar neutral position and 10 of ankle dorsiflexion and weight bearing stance on the heated orthotic blank. After completion of the molding a base was applied to the orthotic blank. The orthotic was ground to a stable position for weightbearing. The patient ambulated in these and reported they were comfortable without pressure spots.              BLANK:  Size 9 - Standard Cushioned                 BASE:  Blue EVA      POSTINGS:  n/a  

## 2018-01-13 NOTE — Progress Notes (Signed)
Ann Hall. [facility] [phone]  Steph Cheadle - 32 y.o. female MRN [id number]  Date of birth: May 28, 1986  Visit Date: 01/13/2018  PCP: Pearline Cables, MD   Referred by: Pearline Cables, MD   Scribe for today's visit: Stevenson Clinch, CMA     SUBJECTIVE:  Evita Merida is here for Follow-up (plantar fasciitis L foot)  12/11/17: Her L plantar fascia symptoms INITIALLY: Began in June 2018 w/ no MOI.  Pt is a runner but wasn't increasing mileage.  Has prior hx of R plantar fascia. Described as a 7/10 at it's worst and is described as a terrible bruise in her heel and a 2/10 at rest, radiating to the L plantar foot. Worsened with prolonged weight bearing and running. Improved with rest/non-weightbearing Additional associated symptoms include: notes some N/T in her B lower legs and feet At this time symptoms show no change compared to onset  She has been using Hoka shoes, has been to PT, used Meloxicam with no noticeable relief. Saw Dr. Jennette Kettle and tried meloxicam x 4 weeks.  Has intermittently stopped running and currently hasn't run since Oct 2018.  Saw Ilene Qua for PT x 5 visits. Works as a Systems analyst at [employer]  01/13/18: Compared to the last office visit, her previously described symptoms show no change  Current symptoms are moderate & are nonradiating She has been wearing Body Helix compression sleeve, taking Gabapentin, and using topical Pennsaid. Today she would like to have custom orthotics made.    ROS Denies night time disturbances. Denies fevers, chills, or night sweats. Denies unexplained weight loss. Denies personal history of cancer. Denies changes in bowel or bladder habits. Denies recent unreported falls. Denies new or worsening dyspnea or wheezing. Denies headaches or dizziness.  Reports numbness, tingling or weakness  In the extremities.  Denies dizziness or presyncopal  episodes Denies lower extremity edema    HISTORY & PERTINENT PRIOR DATA:  Prior History reviewed and updated per electronic medical record.  Significant history, findings, studies and interim changes include:  reports that  has never smoked. she has never used smokeless tobacco. Recent Labs    09/17/17 1104  HGBA1C 5.2   No specialty comments available. No problems updated.  OBJECTIVE:  VS:  HT:5' 10.5" (179.1 cm)   WT:141 lb (64 kg)  BMI:19.94    BP:110/70  HR:(!) 57bpm  TEMP: ( )  RESP:99 %   PHYSICAL EXAM: Constitutional: WDWN, Non-toxic appearing. Psychiatric: Alert & appropriately interactive.  Not depressed or anxious appearing. Respiratory: No increased work of breathing.  Trachea Midline Eyes: Pupils are equal.  EOM intact without nystagmus.  No scleral icterus  NEUROVASCULAR exam: No clubbing or cyanosis appreciated No significant venous stasis changes Capillary Refill: normal, less than 2 seconds   R foot with overpronation with slight external rotation of the foot at rest.  She has splay toe between the first and second toes and early claw toe deformities bilaterally.  Moderate pain over the calcaneus as well as pain over the tarsal tunnel.   ASSESSMENT & PLAN:   1. Plantar fasciitis of left foot   2. Neuritis of ankle, left   3. Loss of transverse plantar arch of right foot   4. Gait disturbance    ++++++++++++++++++++++++++++++++++++++++++++ Orders & Meds: No orders of the defined types were placed in this encounter.  No orders of the defined types were placed in this encounter.   ++++++++++++++++++++++++++++++++++++++++++++  PLAN: Custom orthotics fabricated today per procedure note.  Follow-up as scheduled  Follow-up: Return for as scheduled.   Pertinent documentation may be included in additional procedure notes, imaging studies, problem based documentation and patient instructions. Please see these sections of the encounter for additional  information regarding this visit. CMA/ATC served as Neurosurgeonscribe during this visit. History, Physical, and Plan performed by medical provider. Documentation and orders reviewed and attested to.      Andrena MewsMichael D Yuvin Bussiere, DO    [facility] Physician

## 2018-01-18 ENCOUNTER — Telehealth: Payer: Self-pay | Admitting: Family Medicine

## 2018-01-18 NOTE — Telephone Encounter (Signed)
Called and spoke to pt relaying info about her medications and also discussed the pain scale w/ activity to help her in determining whether activity level is ok or too much.  Reminded pt of her next f/u appt on February 17, 2018 at 2 pm

## 2018-01-18 NOTE — Telephone Encounter (Signed)
See note

## 2018-01-18 NOTE — Telephone Encounter (Signed)
Copied from CRM [phone]. Topic: Quick Communication - See Telephone Encounter >> Jan 18, 2018 11:39 AM Oneal GroutSebastian, Jennifer S wrote: CRM for notification. See Telephone encounter for:  Would like to speak with nurse regarding follow up questions from appt. With Dr Berline Choughigby Would like to know if gabapentin (NEURONTIN) 100 MG capsule is helping for healing or is this just pain control? As well as  Diclofenac Sodium (PENNSAID) 2 % SOLN ? Needing to understand if moving is helpful or harmful to healing process? 01/18/18.

## 2018-01-18 NOTE — Telephone Encounter (Signed)
The Pennsaid is helping decrease inflammation in the ankle and around the nerve and I would like for her to continue this as long as the skin rash is not severe.  The gabapentin does help more so with symptom control than with true nerve healing however trying to minimize her symptoms while she is working on normalizing her motion can allow for more rapid recovery.  If the side effects outweigh the benefit I am fine with her discontinuing either of these medicines  Increasing her activity as tolerated is absolutely fine to do and will help with healing as long as she is not have any exacerbation in her pain.  If she is having significant worsening she should cut back and reduce the amount of activity she is doing.

## 2018-01-29 ENCOUNTER — Telehealth: Payer: Self-pay | Admitting: Sports Medicine

## 2018-01-29 NOTE — Telephone Encounter (Signed)
Copied from CRM [id number]. Topic: General - Other >> Jan 29, 2018 11:48 AM Maia Pettiesrtiz, Kristie S wrote: Reason for CRM: pt called stating she has inserts for shoes. R insole is causing pain in R knee. She is not sure the L is being helpful. Pt requesting call back.

## 2018-01-29 NOTE — Telephone Encounter (Signed)
Returned pt's call and discussed the need for her to return to the office for a f/u visit to go over her concerns w/ Dr. Berline Choughigby.  Pt states that she will call back next week and schedule a f/u appt once she knows her work schedule.

## 2018-02-14 ENCOUNTER — Other Ambulatory Visit: Payer: Self-pay | Admitting: Sports Medicine

## 2018-02-17 ENCOUNTER — Ambulatory Visit: Payer: 59 | Admitting: Sports Medicine

## 2018-02-17 ENCOUNTER — Encounter: Payer: Self-pay | Admitting: Sports Medicine

## 2018-02-17 VITALS — BP 110/76 | HR 65 | Ht 70.5 in | Wt 141.4 lb

## 2018-02-17 DIAGNOSIS — M722 Plantar fascial fibromatosis: Secondary | ICD-10-CM

## 2018-02-17 DIAGNOSIS — R269 Unspecified abnormalities of gait and mobility: Secondary | ICD-10-CM | POA: Diagnosis not present

## 2018-02-17 DIAGNOSIS — G5792 Unspecified mononeuropathy of left lower limb: Secondary | ICD-10-CM | POA: Diagnosis not present

## 2018-02-17 DIAGNOSIS — M216X1 Other acquired deformities of right foot: Secondary | ICD-10-CM

## 2018-02-17 NOTE — Patient Instructions (Signed)

## 2018-02-17 NOTE — Progress Notes (Signed)
Ann Hall. [facility] at [facility] [phone]  Mohini Heathcock - 32 y.o. female MRN [id number]  Date of birth: Feb 13, 1986  Visit Date: 02/17/2018  PCP: Pearline Cables, MD   Referred by: Pearline Cables, MD  Scribe for today's visit: Stevenson Clinch, CMA     SUBJECTIVE:  Ann Hall is here for Follow-up (L foot and ankle pain)  12/11/17: Her L plantar fascia symptoms INITIALLY: Began in June 2018 w/ no MOI.  Pt is a runner but wasn't increasing mileage.  Has prior hx of R plantar fascia. Described as a 7/10 at it's worst and is described as a terrible bruise in her heel and a 2/10 at rest, radiating to the L plantar foot. Worsened with prolonged weight bearing and running. Improved with rest/non-weightbearing Additional associated symptoms include: notes some N/T in her B lower legs and feet At this time symptoms show no change compared to onset  She has been using Hoka shoes, has been to PT, used Meloxicam with no noticeable relief. Saw Dr. Jennette Kettle and tried meloxicam x 4 weeks.  Has intermittently stopped running and currently hasn't run since Oct 2018.  Saw Ilene Qua for PT x 5 visits. Works as a Systems analyst at [employer]  01/13/18: Compared to the last office visit, her previously described symptoms show no change  Current symptoms are moderate & are nonradiating She has been wearing Body Helix compression sleeve, taking Gabapentin, and using topical Pennsaid. Today she would like to have custom orthotics made.   02/17/2018: Compared to the last office visit, her previously described symptoms are improving slightly. She has noticed increased pain in the R knee since having orthotics adjusted.  Current symptoms are moderate & are nonradiating. Pain is worse in the evenings.  She has been wearing Body Helix compression sleeve, using topical Pennsaid once daily. She has custom orthotics made 01/13/18. She is no longer taking  Gabapentin d/t "severe mental side effects". Sx have resolved since d/c Gabapentin about 1 month ago.   ROS Denies night time disturbances. Denies fevers, chills, or night sweats. Denies unexplained weight loss. Denies personal history of cancer. Denies changes in bowel or bladder habits. Denies recent unreported falls. Denies new or worsening dyspnea or wheezing. Denies headaches or dizziness.  Denies numbness, tingling or weakness  In the extremities.  Denies dizziness or presyncopal episodes Denies lower extremity edema    HISTORY & PERTINENT PRIOR DATA:  Prior History reviewed and updated per electronic medical record.  Significant/pertinent history, findings, studies include:  reports that she has never smoked. She has never used smokeless tobacco. Recent Labs    09/17/17 1104  HGBA1C 5.2   No specialty comments available. No problems updated.  OBJECTIVE:  VS:  HT:5' 10.5" (179.1 cm)   WT:141 lb 6.4 oz (64.1 kg)  BMI:20    BP:110/76  HR:65bpm  TEMP: ( )  RESP:99 %   PHYSICAL EXAM: Constitutional: WDWN, Non-toxic appearing. Psychiatric: Alert & appropriately interactive.  Not depressed or anxious appearing. Respiratory: No increased work of breathing.  Trachea Midline Eyes: Pupils are equal.  EOM intact without nystagmus.  No scleral icterus  Vascular Exam: warm to touch no edema  lower extremity neuro exam: unremarkable  MSK Exam: She does have positive Tinel's at the medial ankle.  That localizes to the calcaneus.  Dorsi flexion plantar flexion is improved from a motor control standpoint.  She is working on forefoot mobility as well which  has had improved flexibility..   ASSESSMENT & PLAN:  No diagnosis found.  PLAN:>50% of this 25 minute visit spent in direct patient counseling and/or coordination of care.  Discussion was focused on education regarding the in discussing the pathoetiology and anticipated clinical course of the above condition.  Ultimately  we had a long discussion today regarding how to return to activities.  She has been able to tolerate returning in the setting of having persistent pain.  Does not seem to be significantly worsening however with activities and she is willing to try to push through it I do not think she can cause any significant worsening damage and actually think that the small modifications made today to her orthotics may provide additional benefit.  She should continue with compression and can consider capsaicin cream.  She will also begin working on foundations training exercises.  Links to [employer] videos provided today per Patient Instructions.  These exercises were developed by Myles LippsEric Goodman, DC with a strong emphasis on core neuromuscular reducation and postural realignment through body-weight exercises.  Follow-up: Return in about 8 weeks (around 04/14/2018).      Please see additional documentation for Objective, Assessment and Plan sections. Pertinent additional documentation may be included in corresponding procedure notes, imaging studies, problem based documentation and patient instructions. Please see these sections of the encounter for additional information regarding this visit.  CMA/ATC served as Neurosurgeonscribe during this visit. History, Physical, and Plan performed by medical provider. Documentation and orders reviewed and attested to.      Andrena MewsMichael D Rigby, DO    [facility] Phys[facility]

## 2018-04-14 ENCOUNTER — Ambulatory Visit: Payer: 59 | Admitting: Sports Medicine

## 2018-06-10 ENCOUNTER — Encounter: Payer: Self-pay | Admitting: Sports Medicine

## 2018-06-10 ENCOUNTER — Ambulatory Visit: Payer: 59 | Admitting: Sports Medicine

## 2018-06-10 VITALS — BP 108/68 | Ht 70.0 in | Wt 145.0 lb

## 2018-06-10 DIAGNOSIS — G5752 Tarsal tunnel syndrome, left lower limb: Secondary | ICD-10-CM | POA: Diagnosis not present

## 2018-06-10 DIAGNOSIS — M216X1 Other acquired deformities of right foot: Secondary | ICD-10-CM

## 2018-06-10 DIAGNOSIS — M722 Plantar fascial fibromatosis: Secondary | ICD-10-CM | POA: Diagnosis not present

## 2018-06-10 DIAGNOSIS — M216X2 Other acquired deformities of left foot: Secondary | ICD-10-CM

## 2018-06-10 NOTE — Patient Instructions (Signed)
Plantar fasciitis - continue plantar fascia stretches, calf raises - stop icing - will continue with orthotics with modification  Pronation of foot - Line running 3x per week - Practice landing feet on the line for 2min on with 1min of walking in between for 20-5230min. May continue to jog/run if not painful or worsening symptoms  Tarsal tunnel syndrome - Tibial nerve irritation  - 100mg  Vit B6 daily - will avoid gabapentin d/t side effects - follow up in 4 weeks - use hot water bath 1-2x daily to help with nerve irritation - eliminate sleeve  Feel free to call if you have any questions.

## 2018-06-10 NOTE — Progress Notes (Signed)
Ann Hall - 32 y.o. female MRN [id number]  Date of birth: [birth year]   Chief complaint: L heel pain, lateral ankle pain, left medial ankle numbness/pain  SUBJECTIVE:    History of present illness: 32 yo female who presents today for a multitude of complaints. Her most bothersome issue is L heel pain. This is a chronic issue for her.  She has been diagnosed with plantar fasciitis.  She was managed with meloxicam, physical therapy, and new Hoka shoes.  She has modified her activities and has not ran since October 2018. She also has used a body helix compression sleeve, gabapentin, and topical Pennsaid.  Finally has been made custom orthotics which she states definitely did help her heel pain.  These were made in March 2019.  She did discontinue taking the gabapentin due to suicidal ideations.  She no longer has these after discontinuing the medicine.  She did have an ultrasound performed by Dr. Berline Chough which demonstrated swelling of her plantar fascia with a measurement of 0.6 cm indicating enlargement.  She denies any morning heel pain.  Her pain is primarily after she runs or at night before she goes to bed.  Medial and lateral ankle pain.  The patient has been struggling with medial ankle pain for 3 to 4 months.  She gets a sharp stabbing pain posterior to her medial malleolus.  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  She has tried the treatment modalities as stated above.  She has persistent medial ankle pain.  Also, a new problem that developed was lateral ankle pain.  This is been present for approximately 6 to 8 weeks.  It started immediately after her orthotics were made.  She feels like she is walking and jogging on the lateral aspect of her foot.  She has stopped going on long walks or jogging because of this.  She denies any numbness or tingling of the toes.  Denies any significant knee or hip pain.  Denies any  low back pain.  No loss of bowel or bladder function.  She denies any fevers, chills or night sweats.  Denies any headache or changes in vision.  Denies any nausea, vomiting, diarrhea or constipation.  Denies any changes in urinary frequency.   Review of systems:  A 10 point review of systems was performed and pertinent positives and negatives are stated above.   Past medical history: History of abnormal Pap smear, plantar fasciitis, Baxters neuritis, hip abductor tendinitis Surgical history: colposcopy, wisdom teeth extraction Family history significant for thyroid disease, pancreatic cancer, lymphoma, myocardial infarction, breast cancer, and stroke Medications: Diclofenac gel Allergies: Gabapentin causing suicidal ideations Social history: Non-smoker, works at [employer]  OBJECTIVE:  Physical exam: Vital signs are reviewed. BP 108/68   Ht 5\' 10"  (1.778 m)   Wt 145 lb (65.8 kg)   BMI 20.81 kg/m    Gen.: Alert, oriented, appears stated age, in no apparent distress Eyes: EOMI, conjunctiva clear ENT: nares patent, Moist oral mucosa Respiratory: Normal respirations, able to speak in full sentences Cardiac: Regular rate, distal pulses 2+ Integumentary: No rashes  Neurologic:  hypersensitivity posterior and inferior to medial malleolus Gait: Pronation of L foot when barefoot walking as well as mild pronation with orthotics Psych: affect normal, appropriate responses to questions, following commands Musculoskeletal: Inspection of the left foot demonstrates no acute abnormalities.  Exquisite tenderness to palpation posteriorly to the medial malleolus and inferiorly.  Mild tenderness to palpation posteriorly and inferiorly to the lateral malleolus.  No heel tenderness to palpation today.  Patient has full range of motion in ankle dorsiflexion and plantarflexion.  Strength testing 5 out of 5 in ankle dorsiflexion and plantarflexion.  5 out of 5 in EHL and FHL strength.  Negative anterior drawer.   Negative ankle inversion test.  Negative ankle E version test.  Patient is able to go up on her toes however it is mildly painful.  She is also able to do a single-leg squat with only mild discomfort laterally.  Positive Tinel's test over the tarsal tunnel.  Reflexes L4 2 out of 4, S1 2 out of 4.  Dorsalis pedis pulse 2 out of 4.  ULTRASOUND: L foot Quick, limited diagnostic ultrasound obtained of patient's L heel. - Plantar fascia visualized and measured to 0.49cm without significant swelling. - Prominent tibial nerve with inflammatory hypoechoic fluid surrounding - Achilles is intact and normal.  IMPRESSION: findings consistent with healing plantar fasciitis, posterior tibial nerve inflammation.    ASSESSMENT & PLAN: Plantar fasciitis, improving - measurements improved from 0.6cm to 0.49cm, reviewed images of prior ultrasound and compared to one performed today - continue plantar fascia stretches, calf raises - stop icing as it is exacerbating symptoms - will continue with orthotics with modification. Orthotics were modified by grinding lateral arch edge which was causing symptoms with ambulation. Patient reported significant improvement when gait was analyzed afterwards both in walking and jogging.  Pronation of foot, bilateral - Line running 3x per week to correct toe out posture on right - Practice landing feet on the line for 2min on with 1min of walking in between for 100 yards or 20-6030min. 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  Note reviewed and modified by me. Enid BaasKarl Tyronica Truxillo, MD

## 2018-06-17 LAB — BASIC METABOLIC PANEL
BUN: 8 (ref 4–21)
Creatinine: 0.8 (ref 0.5–1.1)
Glucose: 92
POTASSIUM: 3.9 (ref 3.4–5.3)
Sodium: 139 (ref 137–147)

## 2018-06-17 LAB — HEPATIC FUNCTION PANEL
ALT: 11 (ref 7–35)
AST: 19 (ref 13–35)
Alkaline Phosphatase: 70 (ref 25–125)
Bilirubin, Total: 1

## 2018-06-17 LAB — VITAMIN B12: Vitamin B-12: 454

## 2018-06-17 LAB — VITAMIN D 25 HYDROXY (VIT D DEFICIENCY, FRACTURES): Vit D, 25-Hydroxy: 32

## 2018-07-22 ENCOUNTER — Encounter: Payer: Self-pay | Admitting: Sports Medicine

## 2018-07-22 ENCOUNTER — Ambulatory Visit (INDEPENDENT_AMBULATORY_CARE_PROVIDER_SITE_OTHER): Payer: 59 | Admitting: Sports Medicine

## 2018-07-22 VITALS — BP 108/68 | Ht 70.0 in | Wt 145.0 lb

## 2018-07-22 DIAGNOSIS — G5752 Tarsal tunnel syndrome, left lower limb: Secondary | ICD-10-CM

## 2018-07-22 DIAGNOSIS — M722 Plantar fascial fibromatosis: Secondary | ICD-10-CM | POA: Diagnosis not present

## 2018-07-22 DIAGNOSIS — M216X1 Other acquired deformities of right foot: Secondary | ICD-10-CM

## 2018-07-22 DIAGNOSIS — M7050 Other bursitis of knee, unspecified knee: Secondary | ICD-10-CM | POA: Diagnosis not present

## 2018-07-22 DIAGNOSIS — M216X2 Other acquired deformities of left foot: Secondary | ICD-10-CM

## 2018-07-22 NOTE — Assessment & Plan Note (Signed)
-   point tender on R pes anserine - hamstring exercises for stretching and strengthening of SGT - topical Pennsaid over area - body helix knee sleeve given today.

## 2018-07-22 NOTE — Progress Notes (Signed)
Ann Hall [name] - 32 y.o. female MRN [id number]  Date of birth: 07/29/1986   Chief complaint: L foot pain, R knee pain  SUBJECTIVE:    History of present illness: Ann Hall is a 32 year old female who presents today for follow-up of her left ankle and foot pain as well as her right knee pain.  She has been diagnosed with tarsal tunnel syndrome of her left ankle and foot.  She states that since starting vitamin B6 and performing ankle range of motion exercises, her symptoms have improved slightly.  She feels a mild burning sensation when she starts to run however this does not limit her from doing activities.  In terms of her calcaneal foot pain, this has improved slightly.  She has been treated with immobilization in the past and anti-inflammatories which slightly helped.  She also does have custom orthotics which ultimately improved her plantar fasciitis.  She denies any lateral or medial ankle pain today.  Her calcaneal pain is rated a 3 out of 10 in nature and is nonradiating.  This does not limit her from exercising however she does notice a dull ache at the end of the day when she is on her feet all the time.  Her other issue is her right knee pain.  This is been present for approximately 10 years however it has acutely worsened in the last 4 months.  It is located on the medial aspect of the knee and is worse with progressive walking running or squatting exercises.  She has tried wearing a knee sleeve on this in the past however this did not help her and actually worsened her pain.  She occasionally takes Aleve which does help with her symptoms.  Denies any side effects of the medications.  She denies any left or right hip pain.  She denies any right-sided ankle pain.  Since her last visit, she has tried retraining her gait with doing her foot by foot line running.  She states this has overall been going well.  She also has been doing calf strengthening exercises and plantar fascial stretches which also has  helped with her symptoms.  Her predominantly limiting factor from running is her right sided knee pain which she would like addressed today.  She first started running 60 minutes at a time 4 times a week which was too much.  She then decreased to approximately 25 minutes per run 3 times a week which she did tolerate.  Denies any low back pain or radicular symptoms.   Review of systems:  As stated above   Interval past medical history, surgical history, family history, and social history obtained and are unchanged.   Medications reviewed and unchanged.  Of note, she is taking Advil as needed as well as vitamin B6. Allergies reviewed and unchanged.  OBJECTIVE:  Physical exam: Vital signs are reviewed. BP 108/68   Ht 5\' 10"  (1.778 m)   Wt 145 lb (65.8 kg)   BMI 20.81 kg/m   Gen.: Alert, oriented, appears stated age, in no apparent distress Neurologic: sensation intact to light touch L4-S1 bilaterally, + Tinel's over L medial ankle Gait: out-toeing of R foot improved from last visit Musculoskeletal: Inspection of the right knee demonstrates no acute abnormalities.  Point tenderness to palpation over the pes anserine bursa.  She has full range of motion in knee flexion extension.  Strength testing is 5 out of 5 in knee flexion extension.  Mild discomfort with resisted extension of her knee at the peds anserine.  Strength testing of the hamstrings was 4.5 out of 5.  Her knee is stable to ligamentous testing.  She is neurovascularly intact.  Inspection of the bilateral feet demonstrate a significant pronation deformity with ambulation.  She has significant tenderness to palpation over her medial calcaneus and plantar aspect of her calcaneus on the left.  She has full range of motion in ankle dorsiflexion plantarflexion.  Her ankles are stable to ligamentous testing.      ASSESSMENT & PLAN: Tarsal tunnel syndrome of left side - reevaluated today - not limiting her from running - discussed  continuing anti-inflammatory medications, doing ankle/knee/hamstring stretches and strengthening - if symptoms worsen, would be a candidate for hydrodissection   Pes anserine bursitis - point tender on R pes anserine - hamstring exercises for stretching and strengthening of SGT - topical Pennsaid over area - body helix knee sleeve given today.  Pronation deformity of both feet - foot pain/gait improved with orthotic modification last visit - may progress to running 30-45 min per run 4x per week - We gave you a series of exercises for hip adduction, knee extension, and hamstring exercises. Do these 1-2x per day for 4-6 weeks.  For retraining, lets start off with:  3x per week: 9/15 to 9/21 3x per week: 9/22 to 9/28 3x per week: 9/29 to 10/5 3x per week: 10/6 to 10/11 3x per week: 10/12 to 10/18  Cross train with pool exercises for minimum 30-78min in the off days. We will see you back in 6 weeks with your progress.  Plantar fasciitis of left foot - improving. - continue with calf stretches and plantar fascia stretches. - frozen water bottle - if worsening, will repeat ultrasound - modification of the orthotics last visit helped significantly    Gustavus Messing, DO Sports Medicine Fellow [place]  I observed and examined the patient with the [facility] Fellow and agree with assessment and plan.  Note reviewed and modified by me. Sterling Big, MD

## 2018-07-22 NOTE — Assessment & Plan Note (Signed)
-   reevaluated today - not limiting her from running - discussed continuing anti-inflammatory medications, doing ankle/knee/hamstring stretches and strengthening - if symptoms worsen, would be a candidate for hydrodissection

## 2018-07-22 NOTE — Assessment & Plan Note (Signed)
-   foot pain/gait improved with orthotic modification last visit - may progress to running 30-45 min per run 4x per week - if having set backs, may have to refer to PT vs sports psychology

## 2018-07-22 NOTE — Patient Instructions (Signed)
Today we discussed your symptoms with the "heal pain triad"  1. Plantar fasciitis - improved. Continue doing your plantar fascia stretches, frozen water bottle, calf exercises  2. Tarsal tunnel (burning in foot) - keep up with Vit B6. If this limits you from running, please let us know.  3. Posterior tibial tendon dysfunction - resolved  We also diagnosed you with Pes anserine bursitis. Recommend topical Pennsaid over the area 2-3 times per day. We gave you a series of exercises for hip adduction, knee extension, and hamstring exercises. Do these 1-2x per day for 4-6 weeks.  For retraining, lets start off with:  25min 3x per week: 9/15 to 9/21 30min 3x per week: 9/22 to 9/28 35min 3x per week: 9/29 to 10/5 40min 3x per week: 10/6 to 10/11 45min 3x per week: 10/12 to 10/18  Cross train with pool exercises for minimum 30-3360min in the off days. We will see you back in 6 weeks with your progress.

## 2018-07-22 NOTE — Assessment & Plan Note (Signed)
-   improving. - continue with calf stretches and plantar fascia stretches. - frozen water bottle - if worsening, will repeat ultrasound - modification of the orthotics last visit helped significantly

## 2018-09-02 ENCOUNTER — Ambulatory Visit: Payer: 59 | Admitting: Sports Medicine

## 2018-09-02 ENCOUNTER — Encounter: Payer: Self-pay | Admitting: Sports Medicine

## 2018-09-02 VITALS — BP 112/72 | Ht 70.0 in | Wt 145.0 lb

## 2018-09-02 DIAGNOSIS — G5752 Tarsal tunnel syndrome, left lower limb: Secondary | ICD-10-CM | POA: Diagnosis not present

## 2018-09-02 DIAGNOSIS — M76891 Other specified enthesopathies of right lower limb, excluding foot: Secondary | ICD-10-CM | POA: Diagnosis not present

## 2018-09-02 DIAGNOSIS — M7918 Myalgia, other site: Secondary | ICD-10-CM | POA: Diagnosis not present

## 2018-09-02 DIAGNOSIS — S161XXA Strain of muscle, fascia and tendon at neck level, initial encounter: Secondary | ICD-10-CM | POA: Diagnosis not present

## 2018-09-02 NOTE — Progress Notes (Signed)
Ann Hall - 32 y.o. female MRN [id number]  Date of birth: Sep 18, 1986   Chief complaint: L foot numbness, R hip pain, L neck pain  SUBJECTIVE:    History of present illness: Patient is a 32 year old female who presents today for reevaluation of left foot numbness.  She has a diagnosis of tarsal tunnel syndrome on the left.  She initially was off for several months from running however in the past 6 weeks, she has been able to progress to 35-minute runs 3 times weekly which is a significant improvement.  She states that at 28 to 30 minutes into her run, she begins to feel numbness and tingling over the dorsum of her foot.  This does get to a point where she has to stop running due to the discomfort.  She has tried gabapentin in the past for treatment of this however she had significant side effects.  She is currently taking vitamin B6 for this.  She is very pleased with her improvement and ability to now run.  She has also been doing hip abduction exercises for strengthening of her bilateral hips.  She only can do these without resistance secondary to continued weakness.  She also does do lunges and occasional squats in her routine.  She still notices that her hips tend to drop on the right side while she is running and lifting.  Denies new symptoms of numbness or tingling in the right extremity.  No low back pain.  She also states that she has been having some neck discomfort posteriorly on the left side since mid July.  She was straining while lifting heavy boxes on her left side which may have caused her symptoms.  Denies any numbness or tingling into her left upper extremity.  She has tried diclofenac gel on the area which does help.  Nothing else seems to make her symptoms better or worse.  No loss of grip strength or weakness in her left upper extremity.   Review of systems:  As stated above   Interval past medical history, surgical history, family history, and social history obtained and are  unchanged.   Medications reviewed and unchanged. Allergies reviewed and unchanged.  OBJECTIVE:  Physical exam: Vital signs are reviewed. BP 112/72   Ht 5\' 10"  (1.778 m)   Wt 145 lb (65.8 kg)   BMI 20.81 kg/m   Gen.: Alert, oriented, appears stated age, in no apparent distress Neck: She has myofascial tenderness in her middle and posterior scalenes on the left side.  She has trigger point tenderness in the mid substance of the posterior scalene.  She has restricted range of motion in rotation to the right of her cervical spine.  She also has restriction in right side bending secondary to pain and tightening.  No midline cervical tenderness. Back: No thoracic or lumbar spine midline tenderness. Integumentary: No rashes or ecchymoses Neurologic:  Significant allodynia of her left medial ankle posteriorly at the level of the tarsal tunnel.  No baseline decrease in sensation in L4-S1 bilaterally however Gait: Out toeing of her right foot approximately 20 to 30 degrees.  This is corrected 50% with a first ray post.  No hip drop with running which is an improvement.  Musculoskeletal: Inspection of the left foot demonstrate no obvious abnormality.  She has exquisite tenderness to palpation of the skin overlying the tarsal tunnel on the left.  She has full range of motion ankle dorsiflexion plantarflexion.  Strength testing 5 out of 5 in ankle dorsi  flexion plantarflexion.  Ankle is stable to ligamentous testing.  She is neurovascular intact.  Inspection of the right lower extremity demonstrates no acute abnormality.  She does have mild tenderness palpation over the greater trochanter.  She also has tenderness in the piriformis muscle mid substance.  Hip abductor on the left is 4.5 out of 5, on the right is 4 out of 5.  Negative logroll test.  Negative Pearlean Brownie and Fadir testing.    ASSESSMENT & PLAN: Neck muscle strain, initial encounter - d/t posterior scalene tightness on L -Discussed  anterior/middle/posterior scalene stretches.  You may perform these while in the shower as well.  You may use a heating pad for this.  This should improve in the next 2 to 4 weeks.  Avoid straining when lifting.  Try to maintain a neutral posture with heavy lifting. -No red flag symptoms currently  Piriformis muscle pain -Right side piriformis tenderness and tightness -Due to out toeing of her right foot, this is a compensatory mechanism -First ray post was placed on the right orthotic today.  This did improve by approximately 50% her out toeing.  I recommend continuing your line running and focusing on hip abductor strengthening to further assist with this -Piriformis muscle stretches and strengthening were also taught back today -We will begin with hip flexion and cross body rotation without resistance and then adding a 2 pound weight to this sets of 103  Tarsal tunnel syndrome of left side -Improving -The goal is to increase the duration of running by approximately 5 to 10 minutes q. weekly with a goal of getting up to 55 to 60 minutes of running uninterrupted -We discussed different active exercises that you may do if you have to take a break mid run -We also discussed that if the rate limiting factor for running is numbness or tingling of your left foot that we may add a neuromodulator like nortriptyline or consider a cortisone injection of the tarsal tunnel however given your rapid improvement in the past 6 weeks, we will hold off on these interventions  Hip abductor tendinitis . wekaness, right -Slight improvement in hip strength from last visit -Recommend continued hip abduction exercises -I am recommending doing sets of 103 of hip abduction exercises.  He will add in a 2 pound weight and continue to do these exercises.  He will also add in single leg squats starting at 45 degrees and progressing to 90 degrees of squat distance   Gustavus Messing, DO Sports Medicine Fellow [place]  I  observed and examined the patient with the [facility] Fellow and agree with assessment and plan.  Note reviewed and modified by me. Enid Baas, MD

## 2018-09-02 NOTE — Assessment & Plan Note (Addendum)
-   d/t posterior scalene tightness on L -Discussed anterior/middle/posterior scalene stretches.  You may perform these while in the shower as well.  You may use a heating pad for this.  This should improve in the next 2 to 4 weeks.  Avoid straining when lifting.  Try to maintain a neutral posture with heavy lifting. -No red flag symptoms currently

## 2018-09-02 NOTE — Patient Instructions (Signed)
-   Keep up the great work! - Lets work on: 1. Hip cross body rotation 3x10 2. Single leg squats 3. Toe raises 4. Hip abduction with 2lb weights 5. Line running for Rt foot to slowly toe more inward  Follow up in 8 weeks to reevaluate. Discussed potentially using Nortriptyline for neuromodulation vs an injection if numbness is not improving.

## 2018-09-02 NOTE — Assessment & Plan Note (Signed)
-  Improving -The goal is to increase the duration of running by approximately 5 to 10 minutes q. weekly with a goal of getting up to 55 to 60 minutes of running uninterrupted -We discussed different active exercises that you may do if you have to take a break mid run -We also discussed that if the rate limiting factor for running is numbness or tingling of your left foot that we may add a neuromodulator like nortriptyline or consider a cortisone injection of the tarsal tunnel however given your rapid improvement in the past 6 weeks, we will hold off on these interventions

## 2018-09-02 NOTE — Assessment & Plan Note (Signed)
-  Right side piriformis tenderness and tightness -Due to out toeing of her right foot, this is a compensatory mechanism -First ray post was placed on the right orthotic today.  This did improve by approximately 50% her out toeing.  I recommend continuing your line running and focusing on hip abductor strengthening to further assist with this -Piriformis muscle stretches and strengthening were also taught back today -We will begin with hip flexion and cross body rotation without resistance and then adding a 2 pound weight to this sets of 103

## 2018-09-02 NOTE — Assessment & Plan Note (Signed)
-  Slight improvement in hip strength from last visit -Recommend continued hip abduction exercises -I am recommending doing sets of 103 of hip abduction exercises.  He will add in a 2 pound weight and continue to do these exercises.  He will also add in single leg squats starting at 45 degrees and progressing to 90 degrees of squat distance

## 2018-10-21 ENCOUNTER — Encounter: Payer: Self-pay | Admitting: Sports Medicine

## 2018-10-21 ENCOUNTER — Ambulatory Visit: Payer: 59 | Admitting: Sports Medicine

## 2018-10-21 DIAGNOSIS — M76891 Other specified enthesopathies of right lower limb, excluding foot: Secondary | ICD-10-CM | POA: Diagnosis not present

## 2018-10-21 DIAGNOSIS — G5752 Tarsal tunnel syndrome, left lower limb: Secondary | ICD-10-CM | POA: Diagnosis not present

## 2018-10-21 DIAGNOSIS — M7050 Other bursitis of knee, unspecified knee: Secondary | ICD-10-CM | POA: Diagnosis not present

## 2018-10-21 NOTE — Assessment & Plan Note (Signed)
HEP has helped and she feels this is best this has felt in years Keep up HEP at least 3 x week

## 2018-10-21 NOTE — Progress Notes (Signed)
CC; MSK problems  Tarsal tunnel: continues to progress her running up to 33 mins straight/ 40 mins with a few walks.  Hypersensitive to touch along arch  Right hip abductor weakness - pain much less; feels that she is stronger  RT. Pes anserine pain - this has been present for !5 years off and on.  With HEP now feels this is 80% better and minimal pain  ROS No joint swelling No pain left hip  PE Athletic, thin W F in NAD BP 114/76   Ht 5\' 10"  (1.778 m)   Wt 145 lb (65.8 kg)   BMI 20.81 kg/m   Hip abduction strength on RT is now normal Minimal pain at GT but some at post border of glut med in hip  Pes anserine  Is nontender  Hypersensitive to light touch at tarsal tunnel on left and along arch  Gait Less turnout of RT foot Heel wedge added to left medial and minimal pronation noted No limp or pain with running

## 2018-10-21 NOTE — Assessment & Plan Note (Signed)
Much improved on HEP Keep up 3x per week

## 2018-10-21 NOTE — Assessment & Plan Note (Signed)
Heel wedge added today Keep up Vit B 6 Use orthotics in all exercises  Reck 3 mos

## 2018-10-27 NOTE — Progress Notes (Signed)
[facility] [address], [address] [place], [place] [address] [phone]  Date:  10/28/2018   Name:  Ann Hall   DOB:  [birth year]   MRN:  [id number]  PCP:  Pearline Cables, MD    Chief Complaint: Medication Refill (declines flu shot, refill on lexapro-increase dose?)   History of Present Illness:  Ann Hall is a 32 y.o. 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  She was ready for some easier work-  She notes that working at [employer] is easy and not stressful She likes to run for exercise, 10- 25 miles per week depending on her race schedule She is seeing PT for her hip and has been off running for about 10 days  She used to see a naturopath in [place],but otherwise does not really see doctors She uses a fiber supplement now- psyillium husk. No other meds Never had any surgery Besides running she enjoys cooking, reading, playing with her dogs-they have 3 She is married to [place]   Flu shot: declines this  Declines pap today   She did put on about 10 lbs after our last visit and this helped with her sleep She went to see a doctor in [facility] in September and they put her on lexapro.  Ann Hall notes that she tends to be very cautious of medications, it took her quite some time to work of the courage to start on Lexapro.  However it does not be helping her, and she is happy with taking it. Her menses are still long cycle; 33-34 days. She will notice PMS and depression for 10- 14 days prior to her menses She feels like the lexapro has improved her mood by 85% not counting her PMS days We discussed options today including increasing her Lexapro versus starting a contraceptive  pill patch or ring.  Discussed possible side effects and how to use a contraceptive continuously in detail with patient today. She has no history of cancer, PE, DVT, smoking, or migraine with aura.  She has no plan to come pregnant.  She brings in some labs from the [facility] clinic which we will abstract today Patient Active Problem List   Diagnosis Date Noted  . Piriformis muscle pain 09/02/2018  . Neck muscle strain, initial encounter 09/02/2018  . Pes anserine bursitis 07/22/2018  . Tarsal tunnel syndrome of left side 06/10/2018  . 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 Neuritis of ankle, left 12/11/2017  . Plantar fasciitis of left foot 12/11/2017  . Hip abductor tendinitis . wekaness, right 07/15/2017  . Plantar fasciitis of right foot 07/15/2017    Past Medical History:  Diagnosis Date  . Abnormal Pap smear of cervix 2008  . Dysmenorrhea     Past Surgical History:  Procedure Laterality Date  . COLPOSCOPY  2008   ? unsure of results  . WISDOM TOOTH EXTRACTION Bilateral 2007    Social History   Tobacco Use  . Smoking status: Never Smoker  . Smokeless tobacco: Never Used  Substance Use Topics  . Alcohol use: Yes    Alcohol/week: 2.0 - 3.0 standard drinks    Types: 2 - 3  Standard drinks or equivalent per week  . Drug use: No    Family History  Problem Relation Age of Onset  . Thyroid disease Mother   . Thyroid disease Sister   . Breast cancer Maternal Grandmother [id number]  . Stroke Maternal Grandmother   . Pancreatic cancer Maternal Grandfather   . Lymphoma Maternal Grandfather   . Heart attack Maternal Grandfather   . Heart attack Paternal Grandfather     Allergies  Allergen Reactions  . Gabapentin Other (See Comments)    Medication list has been reviewed and updated.  Current Outpatient Medications on File Prior to Visit  Medication Sig Dispense Refill  . escitalopram (LEXAPRO) 10 MG tablet TAKE 1/2 TABLET BY ORAL ROUTE EVERY DAY FOR 7 DAYS THEN  1 TABLET BY ORAL ROUTE EVERY DAY THEREAFTER  1   No current facility-administered medications on file prior to visit.     Review of Systems:  As per HPI- otherwise negative.   Physical Examination: Vitals:   10/28/18 1522  BP: 110/70  Pulse: 63  Resp: 16  SpO2: 99%   Vitals:   10/28/18 1522  Weight: 137 lb (62.1 kg)  Height: 5\' 10"  (1.778 m)   Body mass index is 19.66 kg/m. Ideal Body Weight: Weight in (lb) to have BMI = 25: 173.9  GEN: WDWN, NAD, Non-toxic, A & O x 3, slim build, looks well.  Androgenous in appearance HEENT: Atraumatic, Normocephalic. Neck supple. No masses, No LAD. Ears and Nose: No external deformity. CV: RRR, No M/G/R. No JVD. No thrill. No extra heart sounds. PULM: CTA B, no wheezes, crackles, rhonchi. No retractions. No resp. distress. No accessory muscle use. EXTR: No c/c/e NEURO Normal gait.  PSYCH: Normally interactive. Conversant. Not depressed or anxious appearing.  Calm demeanor.    Assessment and Plan: Dysthymia - Plan: escitalopram (LEXAPRO) 10 MG tablet  Refilled her Lexapro at 10 mg daily for now.  She may decide to increase her dose to 15, versus starting on a contraceptive.  She will let me know what she prefers to do.  Either I think is a good option. Declines flu shot today. Offered to do a Pap at her convenience.   Signed Abbe AmsterdamJessica Copland, MD

## 2018-10-28 ENCOUNTER — Encounter: Payer: Self-pay | Admitting: Family Medicine

## 2018-10-28 ENCOUNTER — Ambulatory Visit: Payer: 59 | Admitting: Family Medicine

## 2018-10-28 VITALS — BP 110/70 | HR 63 | Resp 16 | Ht 70.0 in | Wt 137.0 lb

## 2018-10-28 DIAGNOSIS — F341 Dysthymic disorder: Secondary | ICD-10-CM

## 2018-10-28 LAB — FERRITIN: Ferritin: 50

## 2018-10-28 LAB — CO2, TOTAL: Chloride: 106

## 2018-10-28 LAB — FOLATE, RBC AND SERUM: Folate: 11.1

## 2018-10-28 LAB — CALCIUM: Calcium: 9.3

## 2018-10-28 LAB — T3: T3, Free: 2.8

## 2018-10-28 MED ORDER — ESCITALOPRAM OXALATE 10 MG PO TABS: 10.0000 mg | ORAL_TABLET | Freq: Every day | ORAL | 3 refills | Status: DC

## 2018-10-28 NOTE — Patient Instructions (Addendum)
It was very nice to see you again, and give a great holiday season. Just let me know if you would prefer to increase Lexapro versus trying an oral contraceptive.  I think both are good options, and I am glad to prescribe whatever you choose.  It would be prudent to get a Pap smear at some time.  We are glad to do this for you, or can refer to GYN if you would prefer.  Certainly you are welcome to bring in a support person with you if that might make you feel more comfortable

## 2018-11-16 ENCOUNTER — Encounter: Payer: Self-pay | Admitting: Family Medicine

## 2019-01-27 ENCOUNTER — Encounter: Payer: Self-pay | Admitting: Sports Medicine

## 2019-01-27 ENCOUNTER — Ambulatory Visit: Payer: 59 | Admitting: Sports Medicine

## 2019-01-27 ENCOUNTER — Other Ambulatory Visit: Payer: Self-pay

## 2019-01-27 VITALS — BP 104/72 | Ht 70.0 in | Wt 145.0 lb

## 2019-01-27 DIAGNOSIS — S161XXA Strain of muscle, fascia and tendon at neck level, initial encounter: Secondary | ICD-10-CM | POA: Diagnosis not present

## 2019-01-27 DIAGNOSIS — G5792 Unspecified mononeuropathy of left lower limb: Secondary | ICD-10-CM

## 2019-01-27 NOTE — Assessment & Plan Note (Signed)
This is more chronic so I suspect some DDD or other change from remote injuries  Plan XR today  HEOP outlined with patient

## 2019-01-27 NOTE — Patient Instructions (Signed)
I think you likely have some degenerative disc or even arthritic change in the neck This could have been triggered by concussions in past  We will XR to see if this is the case  Neck series of motion, exercise and stretch  Stretch is the following Hands under jaw push upward for a count of 10 and 5 repeats T and I stretches - 5 repeats for 5 breaths Stretch looking down and holding  Strength Limit to isometrics 5 resisted pushes x a count of ten 4 positions  Motion Shake outs Rowing motion pull back - 5 x 5 breths Rotation at neck - RT and LT - as far as you can comfortable go/ hold for 10 secs/ 3 repeats

## 2019-01-27 NOTE — Assessment & Plan Note (Signed)
This is much improved.
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
## 2019-01-28 ENCOUNTER — Ambulatory Visit
Admission: RE | Admit: 2019-01-28 | Discharge: 2019-01-28 | Disposition: A | Discharge status: Home / Self Care | Payer: 59 | Source: Ambulatory Visit | Attending: Sports Medicine | Admitting: Sports Medicine

## 2019-01-28 DIAGNOSIS — S161XXA Strain of muscle, fascia and tendon at neck level, initial encounter: Secondary | ICD-10-CM

## 2019-03-10 ENCOUNTER — Ambulatory Visit: Payer: 59 | Admitting: Sports Medicine

## 2019-04-07 ENCOUNTER — Ambulatory Visit: Payer: 59 | Admitting: Sports Medicine

## 2019-08-19 ENCOUNTER — Ambulatory Visit: Payer: 59 | Admitting: Family Medicine

## 2019-08-19 ENCOUNTER — Other Ambulatory Visit: Payer: Self-pay

## 2019-08-19 ENCOUNTER — Encounter: Payer: Self-pay | Admitting: Family Medicine

## 2019-08-19 ENCOUNTER — Ambulatory Visit
Admission: RE | Admit: 2019-08-19 | Discharge: 2019-08-19 | Disposition: A | Discharge status: Home / Self Care | Payer: 59 | Source: Ambulatory Visit | Attending: Family Medicine | Admitting: Family Medicine

## 2019-08-19 VITALS — BP 108/68 | Ht 70.0 in | Wt 145.0 lb

## 2019-08-19 DIAGNOSIS — M25571 Pain in right ankle and joints of right foot: Secondary | ICD-10-CM

## 2019-08-19 DIAGNOSIS — M25579 Pain in unspecified ankle and joints of unspecified foot: Secondary | ICD-10-CM | POA: Insufficient documentation

## 2019-08-19 NOTE — Assessment & Plan Note (Signed)
Unclear etiology. Her lateral ankle sprain appears improved as she has resolution of pain and swelling over and around the lateral ankle ligaments. They are not ttp. She is tender over the talar dome and has pain on dorsiflexion. - x-ray of ankle and foot to rule out any acute stress fracture - ice, rest, OTC Nsaids; if no improvement in 1 week she will call back and we will order MRI to rule out talar dome injury.

## 2019-08-19 NOTE — Progress Notes (Signed)
Subjective: HPI: Ann Hall is a 33 y.o. presenting to clinic today to discuss the following:  Right Ankle/Foot Pain Patient is a 33y/o female with history of ankle sprains who presents today for continued foot pain after a ankle sprain that occurred during backpacking the [place] about 10 days ago. She "rolled" her ankle and it inverted. Since she was in the woods hiking she laced up her boots and continued to hike. She was able to bear weight but did have pain, swelling, and mild bruising. Her swelling and bruising improved with ice and rest. Now however, she is having continued pain at the top of her ankle radiating down into her foot. No other fall or traumatic event. She is able to bear weight today.  ROS noted in HPI.    Social History   Tobacco Use  Smoking Status Never Smoker  Smokeless Tobacco Never Used   Objective: BP 108/68   Ht 5\' 10"  (1.778 m)   Wt 145 lb (65.8 kg)   BMI 20.81 kg/m  Vitals and nursing notes reviewed  Physical Exam Ankle/Foot, Right: TTP noted at the talus. No visible erythema, swelling, ecchymosis, or bony deformity. Mild notable pes planus/cavus deformity. Transverse arch grossly intact; No evidence of tibiotalar deviation; Range of motion is full in all directions. Strength is 5/5 in all directions. No tenderness at the insertion/body/myotendinous junction of the Achilles tendon; No tenderness on posterior aspects of lateral and medial malleolus; Stable lateral and medial ligaments;  Talar dome is tender; Unremarkable calcaneal squeeze; No plantar calcaneal tenderness; No tenderness over the navicular prominence; No tenderness over cuboid; No pain at base of 5th MT; No tenderness at the distal metatarsals; Able to walk 4 steps.   Assessment/Plan:  Pain in joint, ankle and foot Unclear etiology. Her lateral ankle sprain appears improved as she has resolution of pain and swelling over and around the lateral ankle ligaments. They are not  ttp. She is tender over the talar dome and has pain on dorsiflexion. - x-ray of ankle and foot to rule out any acute stress fracture - ice, rest, OTC Nsaids; if no improvement in 1 week she will call back and we will order MRI to rule out talar dome injury.   PATIENT EDUCATION PROVIDED: See AVS    Diagnosis and plan along with any newly prescribed medication(s) were discussed in detail with this patient today. The patient verbalized understanding and agreed with the plan. Patient advised if symptoms worsen return to clinic or ER.    Orders Placed This Encounter  Procedures  . DG Ankle Complete Right    AP, Lateral, and Mortise views    Standing Status:   Future    Standing Expiration Date:   09/19/2019    Order Specific Question:   Reason for Exam (SYMPTOM  OR DIAGNOSIS REQUIRED)    Answer:   ankle pain    Order Specific Question:   Is patient pregnant?    Answer:   No    Order Specific Question:   Preferred imaging location?    Answer:   [facility]    Order Specific Question:   Radiology Contrast Protocol - do NOT remove file path    Answer:   [email or web address]  . DG Foot Complete Right    Standing Status:   Future    Standing Expiration Date:   09/19/2019    Order Specific Question:   Reason for Exam (SYMPTOM  OR DIAGNOSIS REQUIRED)  Answer:   foot pain    Order Specific Question:   Is patient pregnant?    Answer:   No    Order Specific Question:   Preferred imaging location?    Answer:   [facility]    Order Specific Question:   Radiology Contrast Protocol - do NOT remove file path    Answer:   [email or web address]    Jules Schick, DO 08/19/2019, 10:41 AM PGY-3 [facility]

## 2019-08-19 NOTE — Patient Instructions (Addendum)
It was great to meet you today! Thank you for letting me participate in your care!  Today, we discussed your ankle pain that has continued since you rolled it on your hike. Please wear the boot during the day but you can take it off at night. Please call us in one week and let us know if your pain is improved. If it does not we will get additional imaging. Call us sooner if your pain gets worse. Continue using ice/heat as needed.  Be well, Harolyn Rutherford, DO PGY-3, [facility]

## 2019-08-23 ENCOUNTER — Telehealth: Payer: Self-pay

## 2019-08-23 NOTE — Telephone Encounter (Signed)
Per Dr. Barbaraann Barthel - Let patient know her x-rays look great - no evidence stress fracture. Follow through with recommendations from Dr. Nori Riis.

## 2019-10-18 ENCOUNTER — Other Ambulatory Visit: Payer: Self-pay | Admitting: Family Medicine

## 2019-10-18 DIAGNOSIS — F341 Dysthymic disorder: Secondary | ICD-10-CM

## 2019-10-18 MED ORDER — ESCITALOPRAM OXALATE 10 MG PO TABS: 10.0000 mg | ORAL_TABLET | Freq: Every day | ORAL | 0 refills | Status: DC

## 2019-10-18 NOTE — Telephone Encounter (Signed)
Requested medication (s) are due for refill today: yes  Requested medication (s) are on the active medication list: yes  Last refill:  10/20/2018  Future visit scheduled: yes  Notes to clinic:  Review for refill Overdue for office visit    Requested Prescriptions  Pending Prescriptions Disp Refills   escitalopram (LEXAPRO) 10 MG tablet 90 tablet 3    Sig: Take 1 tablet (10 mg total) by mouth daily.     Psychiatry:  Antidepressants - SSRI Failed - 10/18/2019 10:16 AM      Failed - Valid encounter within last 6 months    Recent Outpatient Visits          11 months ago [place] [facility], Gay Filler, MD   1 year ago Plantar fasciitis of left foot   [place] Rigby, Macclenny D, DO   1 year ago Plantar fasciitis of left foot   [place] Rigby, Legrand Como D, DO   1 year ago Plantar fasciitis of left foot   [place] Rigby, Legrand Como D, DO   1 year ago Plantar fasciitis of left foot   [place] Rigby, Juanda Bond, DO      Future Appointments            In 1 week [place], Gay Filler, MD [place] [facility], [place]           Failed - Completed PHQ-2 or PHQ-9 in the last 360 days.

## 2019-10-18 NOTE — Telephone Encounter (Signed)
Copied from [place] [phone]. Topic: Quick Communication - Rx Refill/Question >> Oct 18, 2019 10:05 AM Leward Quan A wrote: Medication: escitalopram (LEXAPRO) 10 MG tablet   Has the patient contacted their pharmacy? Yes.   (Agent: If no, request that the patient contact the pharmacy for the refill.) (Agent: If yes, when and what did the pharmacy advise?)  Preferred Pharmacy (with phone number or street name): [id number] [place], [place]. [phone] (Phone) [phone] (Fax)    Agent: Please be advised that RX refills may take up to 3 business days. We ask that you follow-up with your pharmacy.

## 2019-10-29 NOTE — Progress Notes (Signed)
[facility] [address] [place], [place] [address] [phone]  Date:  10/31/2019   Name:  Ann Hall   DOB:  Aug 13, 1986   MRN:  [id number]  PCP:  Pearline Cables, MD    Chief Complaint: No chief complaint on file.   History of Present Illness:  Ann Hall is a 33 y.o. 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  Her wife also did have Covid earlier this year, I believe in April Pt with history of a few MSK issues but otherwise generally in good physical health Last seen by myself about a year ago - at that time she was taking lexapro 10 mg for her dysthmia and it was helping  Pap: Tdap: Flu: not done yet- recommended that she should get this   She notes that her menses are quite painful, sometimes her cramps are so bad she is having to take 800 mg of ibupforen 4x/day, and use heat as well to control her symptoms Her bleeding is not heavier, she may pass some clots but this is unchanged She would have more lower back pain, but not as much cramping since she was a teenager, in her early 26s she did well, but in her later 20's/30's her cramping got worse-she has suffered from significant menstrual pains for the last several years She notes that her depression may get worse mid cycle until her menses as well  We have talked about contraceptives in the past to control her menstrual symptoms, she has been hesitant due to concerns about change in her emotional  state.  However she now feels comfortable with giving this a try, we discussed options and decided to use birth control pills  No history of hypertension  BP Readings from Last 3 Encounters:  08/19/19 108/68  01/27/19 104/72  10/28/18 110/70   Non smoker, no history of DVT or PE, no cancer history Her LMP was about 10 days ago She is sexually active only with her female partner, no chance of pregnancy    Patient Active Problem List   Diagnosis Date Noted  . Pain in joint, ankle and foot 08/19/2019  . Piriformis muscle pain 09/02/2018  . Neck muscle strain, initial encounter 09/02/2018  . Pes anserine bursitis 07/22/2018  . Tarsal tunnel syndrome of left side 06/10/2018  . 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 Neuritis of ankle, left 12/11/2017  . Plantar fasciitis of left foot 12/11/2017  . Hip abductor tendinitis . wekaness, right 07/15/2017  . Plantar fasciitis of right foot 07/15/2017    Past Medical History:  Diagnosis Date  . Abnormal Pap smear of cervix 2008  . Dysmenorrhea     Past Surgical History:  Procedure Laterality Date  . COLPOSCOPY  2008   ? unsure of results  . WISDOM TOOTH EXTRACTION Bilateral 2007    Social History   Tobacco Use  . Smoking status: Never Smoker  .  Smokeless tobacco: Never Used  Substance Use Topics  . Alcohol use: Yes    Alcohol/week: 2.0 - 3.0 standard drinks    Types: 2 - 3 Standard drinks or equivalent per week  . Drug use: No    Family History  Problem Relation Age of Onset  . Thyroid disease Mother   . Thyroid disease Sister   . Breast cancer Maternal Grandmother 53  . Stroke Maternal Grandmother   . Pancreatic cancer Maternal Grandfather   . Lymphoma Maternal Grandfather   . Heart attack Maternal Grandfather   . Heart attack Paternal Grandfather     Allergies  Allergen Reactions  . Gabapentin Other (See Comments)    Medication list has been reviewed and updated.  Current Outpatient Medications on  File Prior to Visit  Medication Sig Dispense Refill  . escitalopram (LEXAPRO) 10 MG tablet Take 1 tablet (10 mg total) by mouth daily. 30 tablet 0   No current facility-administered medications on file prior to visit.    Review of Systems:  As per HPI- otherwise negative.   Physical Examination: There were no vitals filed for this visit. There were no vitals filed for this visit. There is no height or weight on file to calculate BMI. Ideal Body Weight:    Patient is observed via video.  Slim build, she appears well.  No cough, wheezing, distress is noted  Assessment and Plan: Menstrual cramps - Plan: levonorgestrel-ethinyl estradiol (ALESSE) 0.1-20 MG-MCG tablet  Virtual visit today to discuss menstrual cramp symptoms.  Ann Hall has suffered from the symptoms for several years, she is using max dose anti-inflammatories and still having pain.  She is decided to try an oral contraceptive, we discussed how to use this, when to start her first pack, and how to skip placebo pills if she wishes to skip menstrual bleeding now and then. She will let me know how this works for her, and how her symptoms respond She also notes more side effect from her Lexapro recently, would like to consider changing this medication but does not want to make too many changes at once.  She will follow-up with me about this issue later if she still wishes to change This visit occurred during the SARS-CoV-2 public health emergency.  Safety protocols were in place, including screening questions prior to the visit, additional usage of staff PPE, and extensive cleaning of exam room while observing appropriate contact time as indicated for disinfecting solutions.    Signed Lamar Blinks, MD

## 2019-10-31 ENCOUNTER — Encounter: Payer: Self-pay | Admitting: Family Medicine

## 2019-10-31 ENCOUNTER — Ambulatory Visit (INDEPENDENT_AMBULATORY_CARE_PROVIDER_SITE_OTHER): Payer: 59 | Admitting: Family Medicine

## 2019-10-31 ENCOUNTER — Other Ambulatory Visit: Payer: Self-pay

## 2019-10-31 DIAGNOSIS — N946 Dysmenorrhea, unspecified: Secondary | ICD-10-CM | POA: Diagnosis not present

## 2019-10-31 MED ORDER — LEVONORGESTREL-ETHINYL ESTRAD 0.1-20 MG-MCG PO TABS: 1.0000 | ORAL_TABLET | Freq: Every day | ORAL | 4 refills | Status: DC

## 2019-11-20 ENCOUNTER — Other Ambulatory Visit: Payer: Self-pay | Admitting: Family Medicine

## 2019-11-20 DIAGNOSIS — F341 Dysthymic disorder: Secondary | ICD-10-CM

## 2019-12-01 ENCOUNTER — Telehealth: Payer: Self-pay

## 2019-12-02 NOTE — Telephone Encounter (Signed)
Is this related to her ankle pain?  If it is, then that would be fine to go ahead and write the letter.  If you need me to do it, send this back and I will be happy to do it.  Otherwise, you can just put that in a letter and routed through my chart. THANKS! Denny Levy

## 2019-12-05 NOTE — Telephone Encounter (Signed)
Patient states the letter is more referring to her tarsal tunnel issue that she has seen Dr. Darrick Penna for in the past, and not her ankle pain. I told her I would talk with him when he's in our office tomorrow and message her via Mychart letting her know his response.

## 2019-12-06 ENCOUNTER — Encounter: Payer: Self-pay | Admitting: Family Medicine

## 2019-12-06 DIAGNOSIS — F341 Dysthymic disorder: Secondary | ICD-10-CM

## 2019-12-07 MED ORDER — ESCITALOPRAM OXALATE 10 MG PO TABS: 10.0000 mg | ORAL_TABLET | Freq: Every day | ORAL | 3 refills | Status: DC

## 2020-02-09 ENCOUNTER — Encounter: Payer: Self-pay | Admitting: Family Medicine

## 2020-04-05 ENCOUNTER — Ambulatory Visit (INDEPENDENT_AMBULATORY_CARE_PROVIDER_SITE_OTHER): Payer: 59 | Admitting: Sports Medicine

## 2020-04-05 ENCOUNTER — Encounter: Payer: Self-pay | Admitting: Sports Medicine

## 2020-04-05 ENCOUNTER — Other Ambulatory Visit: Payer: Self-pay

## 2020-04-05 VITALS — BP 124/80 | Ht 70.75 in | Wt 145.0 lb

## 2020-04-05 DIAGNOSIS — M67472 Ganglion, left ankle and foot: Secondary | ICD-10-CM | POA: Diagnosis not present

## 2020-04-05 DIAGNOSIS — S161XXA Strain of muscle, fascia and tendon at neck level, initial encounter: Secondary | ICD-10-CM

## 2020-04-05 NOTE — Progress Notes (Signed)
[facility] [address] [place], [place] [address] Phone: [phone] Fax: [phone]   Patient Name: Ann Hall Date of Birth: 1986-07-09 Medical Record Number: [id number] Gender: female Date of Encounter: 04/05/2020  SUBJECTIVE:      Chief Complaint:  Follow-up neck pain and left foot pain   HPI:  Ann Hall is a 34 year old presenting with acute on chronic right sided neck pain.  She [place] 7 [place] wearing a heavy backpack, but has stated this is bothered her for multiple years.  The pain is on the right side of her lower neck.  She will sometimes have numbness and tingling in that area, especially when her shirt tag touches the area.  Occasionally, the pain will shoot down to the top of her shoulder.  She has been doing cervical stretches that were taught to her in the past.  She had imaging in March 2020. She is also complaining of a knot on the outside and bottom of her left foot.  She does not recall doing anything specific, but just noticed it.  There is pain with deep palpation to the knot.  She is still able to walk without a limp.  She has not tried any kind of cushion or inserts.  Denies any numbness or tingling.  As a reminder she does have tarsal tunnel on this side that is under good control.  She was in an accident where she tripped down stairs many years ago that she thinks contributed to the neck problems.     ROS:     See HPI.   PERTINENT  PMH / PSH / FH / SH:  Past Medical, Surgical, Social, and Family History Reviewed & Updated in the EMR. Pertinent findings include:  Works as a Physiological scientist, Nurse, learning disability syndrome, cervical kyphosis   OBJECTIVE:  BP 124/80   Ht 5' 10.75" (1.797 m)   Wt 145 lb (65.8 kg)   BMI 20.37 kg/m  Physical Exam:  Vital signs are reviewed.   GEN: Alert and oriented, NAD Pulm: Breathing unlabored PSY: normal mood, congruent affect  MSK: Neck No gross  deformity, swelling, bruising. No midline/bony TTP. FROM. BUE strength 5/5.   Sensation intact to light touch.   2+ equal reflexes in triceps, biceps, brachioradialis tendons. Negative spurlings. NV intact distal BUEs. Reviewed plain films from last March that demonstrate anterior listhesis of lower cervical spine with lack of lordosis.  Left foot No swelling or erythema Small tender nodule on lateral plantar foot at midportion of foot Full ROM at ankle and toes Full strength at ankle and toes Negative compression test Splaying of toes 2 and 3, left worse than right Significant callus of plantar great toe NVI  Limited MSK ultrasound Left foot The flexor tendon was visualized in long and short axis at the plantar surface of the fifth metatarsal without tear.  There was a small ganglion cyst sitting atop the anterior portion of the flexor tendon without crystals or increased vascularity.  Impression: Flexor tendon ganglion cyst of fifth metatarsal  ASSESSMENT & PLAN:   1. Acute on chronic neck pain  I do believe the lack of curvature and listhesis seen on imaging is contributing to her pain.  We provided her with a home exercise program to work on strengthening the musculature of her neck.  So advised she consider purchasing a new hiking backpack that will not right above her shoulder blades too much.  2.  Left foot flexor ganglion cyst  We would prefer to avoid manually aspirating the cyst.  We advised her to apply a warm compress to the foot and gentle massage.  We also fitted her with green sports insoles and made a U pad around the cyst to offload the area.  She can let pain be her guide towards physical activity.   Judge Stall, DO, ATC Sports Medicine Fellow  I observed and examined the patient with Dr. Hulan Fray and agree with assessment and plan.  Note reviewed and modified by me. Sterling Big, MD

## 2020-04-05 NOTE — Patient Instructions (Signed)
Neck  Keep up the I an T stretch Keep up the lateral stretches Add isometric strength exercises as shown to you  Zostrix cream might cut down on burning Watch for skin sensitivity or sunburn  You need the additional inserts and padding for your feet Metatarsal pads UPAD for cyst  Use heat and pressure to try to get the ganglion cyst to drain

## 2020-04-24 ENCOUNTER — Ambulatory Visit: Payer: 59 | Admitting: Sports Medicine

## 2020-06-01 IMAGING — CR DG ANKLE COMPLETE 3+V*R*
3 series · 3 of 3 positions shown · non-contrast
Comparison: None.

CLINICAL DATA: Rolled ankle, pain

EXAM:
RIGHT ANKLE - COMPLETE 3+ VIEW

[t ankle joint ap right]
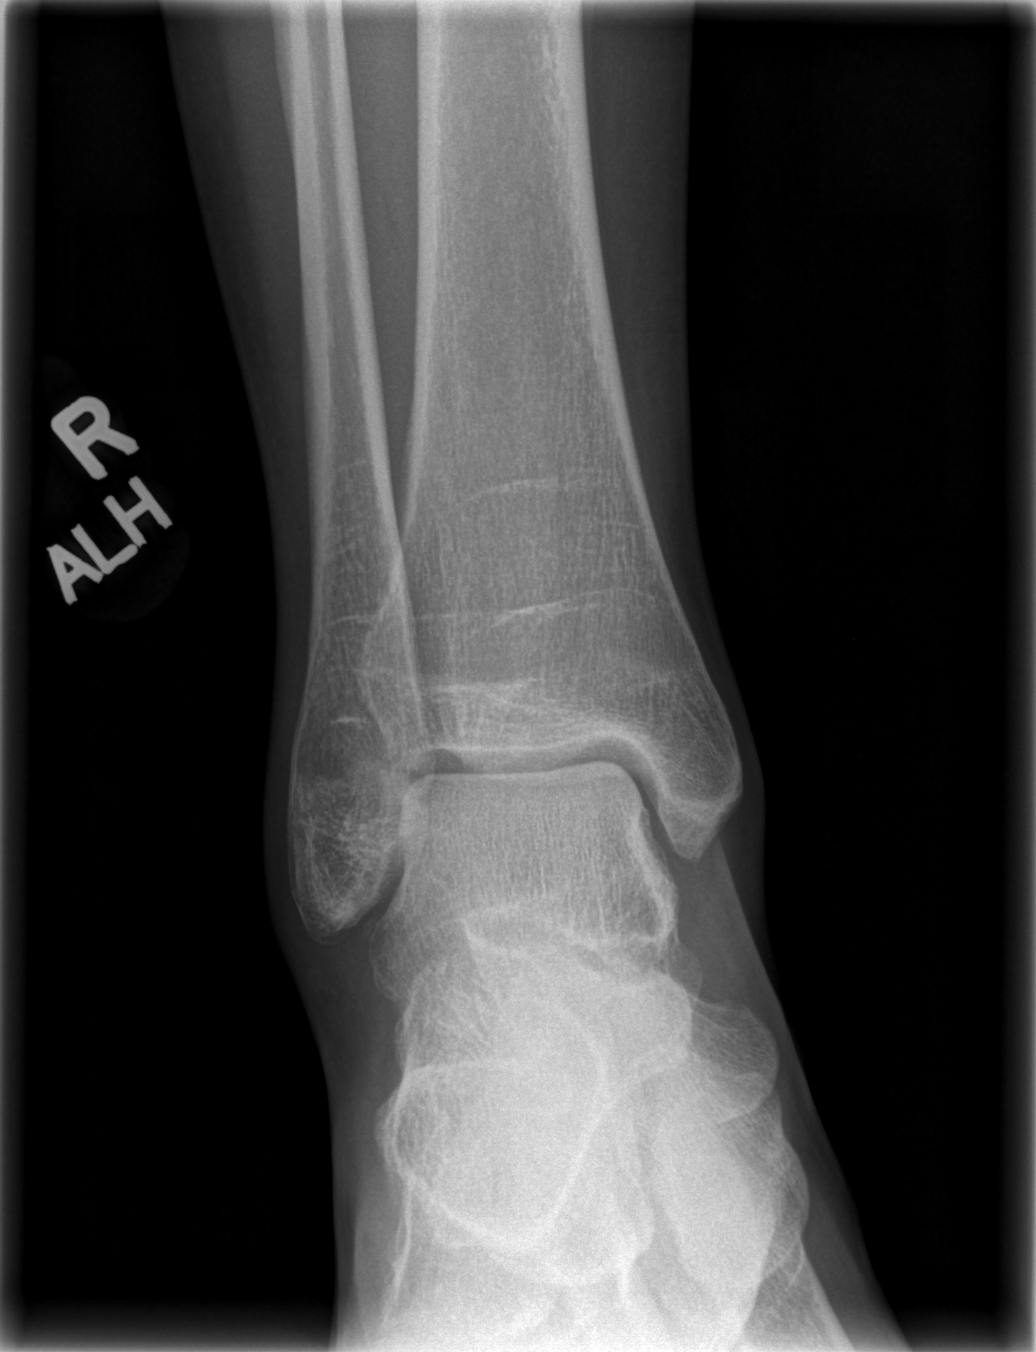

[t ankle joint oblique right]
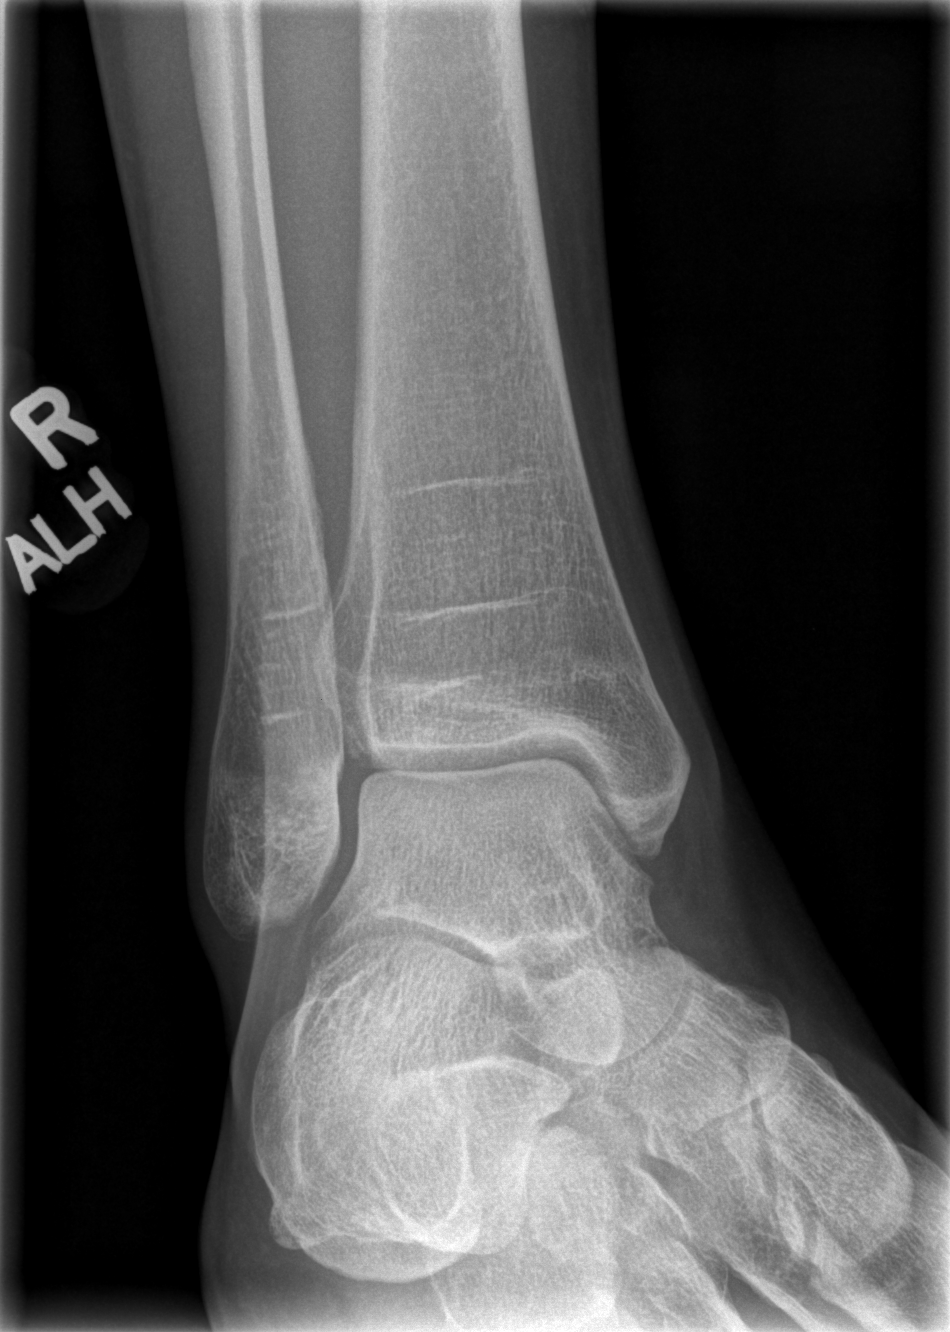

[t ankle joint lat right]
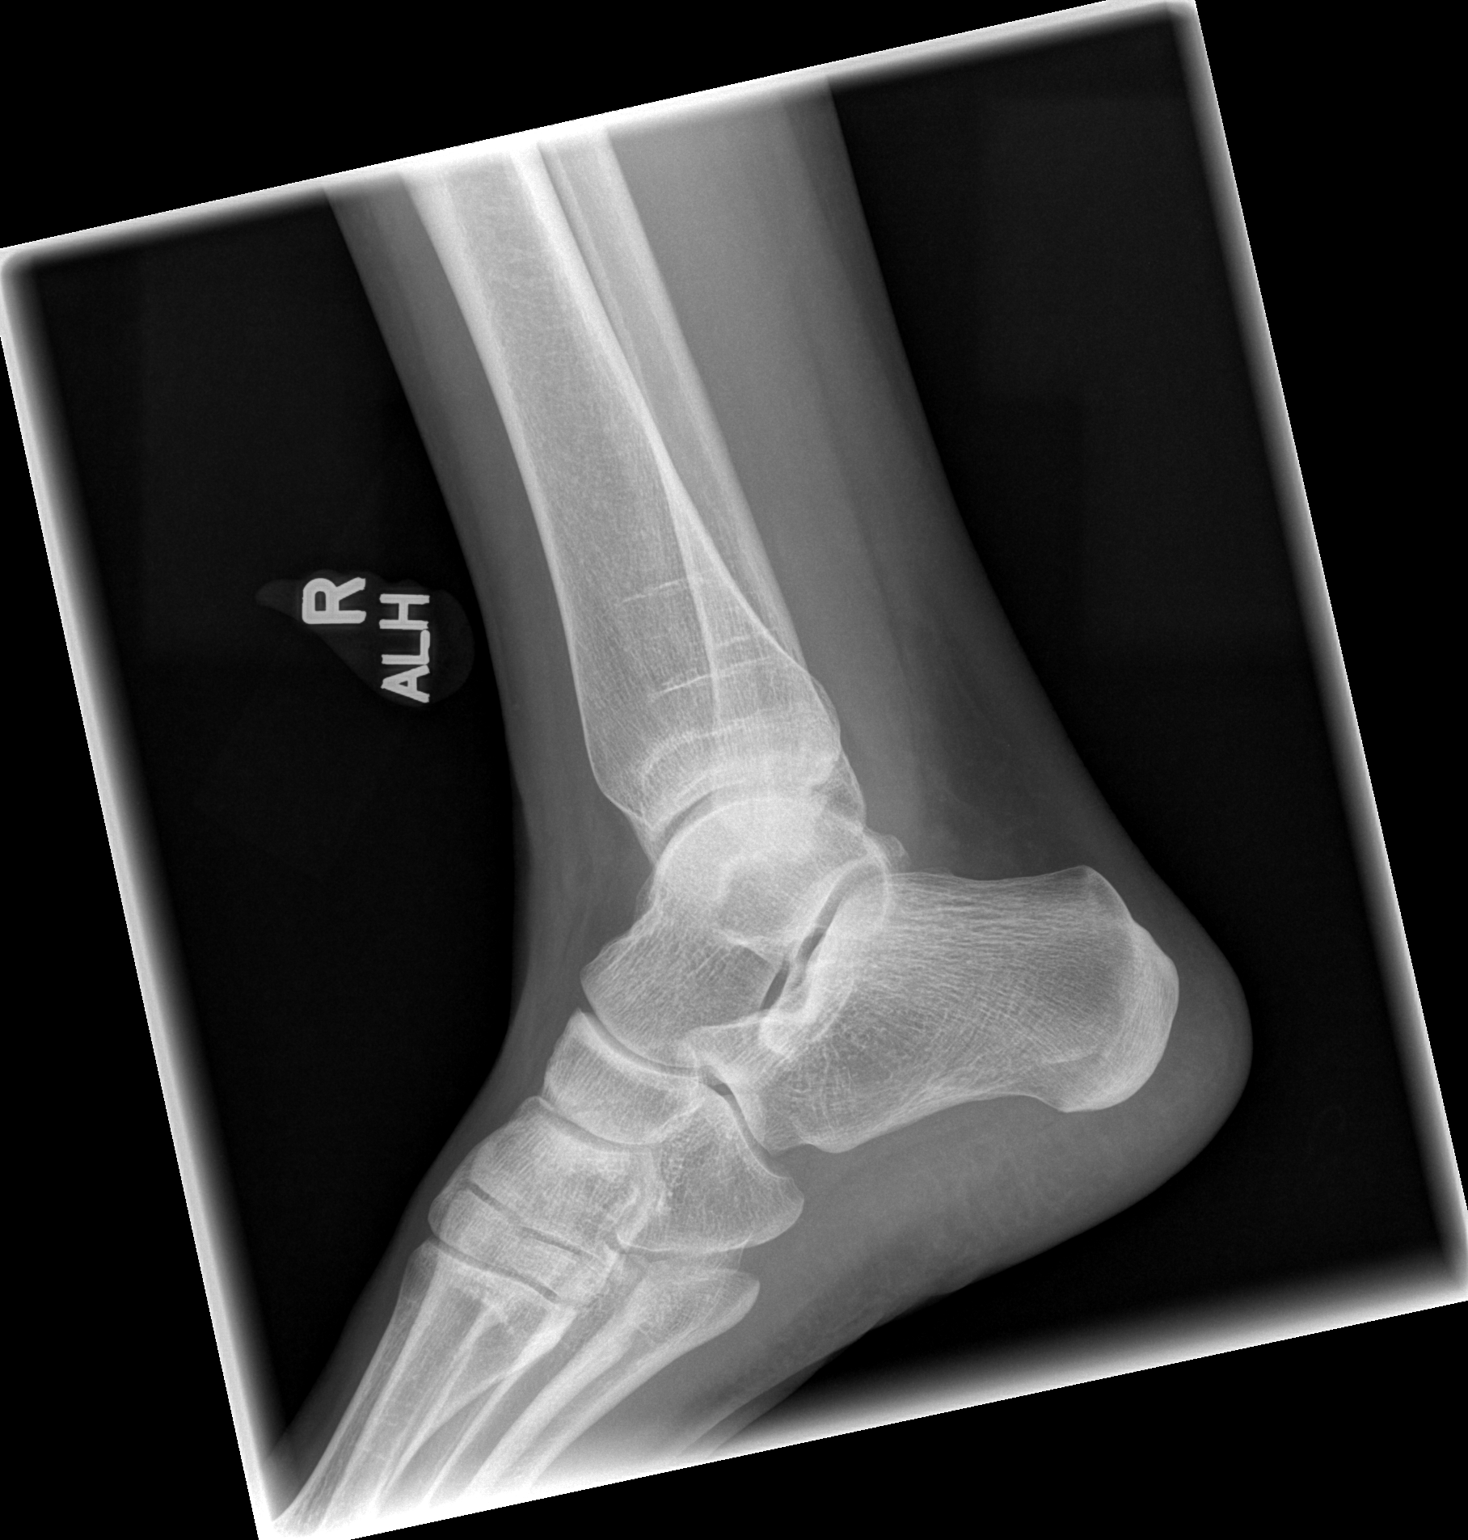

[3 of 3 positions shown; findings below may reference images not displayed]

FINDINGS: No fracture or dislocation of the right ankle. Joint spaces are well
preserved. Soft tissues are unremarkable. There are multiple growth
arrest lines of the distal tibia and fibula.
IMPRESSION: No fracture or dislocation of the right ankle.

## 2020-08-08 ENCOUNTER — Encounter: Payer: Self-pay | Admitting: Family Medicine

## 2020-08-08 MED ORDER — EPINEPHRINE 0.3 MG/0.3ML IJ SOAJ: 0.3000 mg | INTRAMUSCULAR | 99 refills | Status: DC | PRN

## 2020-08-10 ENCOUNTER — Other Ambulatory Visit: Payer: Self-pay

## 2020-08-10 MED ORDER — EPINEPHRINE 0.3 MG/0.3ML IJ SOAJ: 0.3000 mg | INTRAMUSCULAR | 99 refills | Status: AC | PRN

## 2020-10-10 ENCOUNTER — Encounter: Payer: Self-pay | Admitting: Family Medicine

## 2020-10-10 DIAGNOSIS — F341 Dysthymic disorder: Secondary | ICD-10-CM

## 2020-10-10 MED ORDER — ESCITALOPRAM OXALATE 10 MG PO TABS: 10.0000 mg | ORAL_TABLET | Freq: Every day | ORAL | 3 refills | Status: DC

## 2020-12-27 ENCOUNTER — Ambulatory Visit: Payer: 59 | Admitting: Sports Medicine

## 2021-01-01 ENCOUNTER — Ambulatory Visit: Payer: 59 | Admitting: Sports Medicine

## 2021-01-03 ENCOUNTER — Ambulatory Visit (INDEPENDENT_AMBULATORY_CARE_PROVIDER_SITE_OTHER): Payer: 59 | Admitting: Sports Medicine

## 2021-01-03 ENCOUNTER — Other Ambulatory Visit: Payer: Self-pay

## 2021-01-03 VITALS — BP 124/78 | Ht 70.0 in | Wt 150.0 lb

## 2021-01-03 DIAGNOSIS — M7552 Bursitis of left shoulder: Secondary | ICD-10-CM | POA: Diagnosis not present

## 2021-01-03 MED ORDER — METHYLPREDNISOLONE ACETATE 40 MG/ML IJ SUSP
40.0000 mg | Freq: Once | INTRAMUSCULAR | Status: AC
  Administered 2021-01-03: 40 mg via INTRA_ARTICULAR

## 2021-01-03 NOTE — Progress Notes (Signed)
Office Visit Note   Patient: Ann Hall           Date of Birth: [birth year]           MRN: [id number] Visit Date: 01/03/2021 Requested by: Pearline Cables, MD [address] [place],  [place] [address] PCP: Pearline Cables, MD  Subjective: CC: Left shoulder pain  HPI: 35 year old female presenting to clinic with concerns of several months of left shoulder pain.  Patient states that, back in November, she was doing some simple ball exercises when she noticed a sharp pain in her left shoulder.  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  She says her pain is most notable when she is trying to do pull-ups or other similar overhead activities.  A coworker tried to give her shoulder strengthening exercises, which improved pain on the right side however did not help her symptoms on the left.  She endorses significant pain with sleeping on the affected side, or even when she is getting dressed in the morning.  No obvious bruising or weakness in the affected side.              ROS:   All other systems were reviewed and are negative.  Objective: Vital Signs: BP 124/78    Ht 5\' 10"  (1.778 m)    Wt 150 lb (68 kg)    BMI 21.52 kg/m   Physical Exam:  General:  Alert and oriented, in no acute distress. Pulm:  Breathing unlabored. Psy:  Normal mood, congruent affect. Skin: Left shoulder overlying skin intact, no bruising, rashes, or erythema.   Left Shoulder Exam:  Inspection: Symmetric muscle mass, no atrophy or deformity, no scars. Palpation: Endorses tenderness to palpation over long head of the biceps, as well as within the acromial area.  No tenderness over AC joint.   Range of motion: Abduction limited to 90 degrees, able to flex to approximately 120 degrees prior to prohibitive pain.  Internal and external rotation symmetric with right  shoulder.  Rotator cuff testing:  Full strength however endorses pain with empty can (supraspinatus).  External rotation with full strength and no pain. Full strength and no pain with Napoleon and lift off.  AC joint testing: No AC tenderness to palpation, negative scarf test. Does endorse pain with active compression test.  Biceps testing: Endorses pain with speeds as well as Yergason.  Labral testing: O'Brien's/speeds with full strength however endorses pain. Biceps load II: Negative Crank test: Negative  Impingement testing: Endorses pain with Hawkins  Strength testing:  5 out of 5 strength with shoulder abduction (C5), wrist extension (C6), wrist flexion (C7), grip strength (C8), and finger abduction (T1).  Sensation: Intact to light touch throughout bilateral upper extremities.   Brisk distal capillary refill.   Imaging: Upper extremity ultrasound-left shoulder:  Biceps tendon visualized in long and short axis, with no significant surrounding fluid.  Fibers appear intact. Subscapularis tendon intact, with no significant surrounding effusion. Supraspinatus appears intact, no significant cortical irregularities, effusions, or intratendinous defects. Does have enlargement of subacromial bursa, with evidence of impingement upon dynamic imaging with abduction. Infraspinatus and teres minor both appear intact, without surrounding fluid. AC joint visualized, no significant bony spurring.  Negative geyser sign. Glenohumeral joint visualized without significant bony spurring or effusion.  No obvious posterior labral pathology.  Impression: Subacromial bursitis  Assessment &  Plan: 35 year old female presenting to clinic today with left shoulder pain worsened by overhead activities.  Examination and ultrasound concerning for subacromial bursitis as a primary source of her symptoms. -Given her discomfort and limitations with her occupation, offered injection therapy today.   Patient agreed to proceed. -Injection performed as described below, patient voiced immediate improvement in her symptoms during anesthetic phase. -Discussed gradual return to overhead activities, with pain being her guide as she returns to her normal weightlifting. -Patient was happy at the completion of today's visit, she had no further questions or concerns.     Procedures: Left Subacromial Cortisone Injection:  Risks and benefits of procedure discussed, Patient opted to proceed. Written Consent obtained.  Timeout performed.  Skin prepped in a sterile fashion with betadine before further cleansing with alcohol. Ethyl Chloride was used for topical analgesia.  Left Subacromial area was injected with 4cc 1% Lidocaine without epinephrine mixed with 40mg  methylprednisolone via theposterior approach using a 25G, 1.5in needle.   Patient tolerated the injection well with no immediate complications. Aftercare instructions were discussed, and patient was given strict return precautions.      I observed and examined the patient with the [facility] resident and agree with assessment and plan.  Note reviewed and modified by me. [facility], MD

## 2021-01-03 NOTE — Patient Instructions (Signed)
You had an injection today. Things to be aware of after injection are listed below:   . You may experience no significant improvement or even a slight worsening in your symptoms during the first 24 to 48 hours. After that we expect your symptoms to improve gradually over the next 2 weeks for the medicine to have its maximal effect. You should continue to have improvement out to 6 weeks after your injection. . We recommend icing the site of the injection for 20 minutes 1-2 times the day of your injection and as needed for pain over the following several days. . You may shower but no swimming, tub bath or Jacuzzi for 24 hours. . If your bandage falls off this does not need to be replaced. It is appropriate to remove the bandage after 4 hours. . You may resume light activities as tolerated.  It can take several weeks to see improvements following injection. If after 2 weeks you are continuing to have worsening symptoms, please call our office to discuss what the next appropriate actions should be including the potential for a return office visit or other diagnostic testing.  POSSIBLE PROCEDURE SIDE EFFECTS: The side effects of the injection are usually minimal, self-limited, and usually will resolve on their own. Common side effects that can occur over the first several days following injection include:  . Increased numbness or tingling . Worsening pain, stiffness, or slight weakness . Swelling or bruising at the injection site  If you are concerned, please feel free to contact the office with questions.   Please call our office immediately if you experience any of the following symptoms over the next 2 weeks as these can be signs of infection:  . Fever greater than 100.5F . Significant swelling at the injection site . Significant redness or drainage from the injection site  

## 2021-01-08 ENCOUNTER — Ambulatory Visit: Payer: 59 | Admitting: Sports Medicine

## 2021-01-15 ENCOUNTER — Ambulatory Visit: Payer: 59 | Admitting: Sports Medicine

## 2021-02-07 ENCOUNTER — Ambulatory Visit: Payer: 59 | Admitting: Sports Medicine

## 2021-02-28 ENCOUNTER — Other Ambulatory Visit: Payer: Self-pay

## 2021-02-28 ENCOUNTER — Ambulatory Visit: Payer: 59 | Admitting: Sports Medicine

## 2021-02-28 VITALS — BP 116/72 | Ht 70.0 in | Wt 150.0 lb

## 2021-02-28 DIAGNOSIS — M25512 Pain in left shoulder: Secondary | ICD-10-CM

## 2021-02-28 DIAGNOSIS — G8929 Other chronic pain: Secondary | ICD-10-CM

## 2021-02-28 DIAGNOSIS — M545 Low back pain, unspecified: Secondary | ICD-10-CM

## 2021-02-28 MED ORDER — NITROGLYCERIN 0.2 MG/HR TD PT24: MEDICATED_PATCH | TRANSDERMAL | 1 refills | Status: DC

## 2021-02-28 NOTE — Progress Notes (Signed)
Office Visit Note   Patient: Ann Hall           Date of Birth: 1986/11/02           MRN: [id number] Visit Date: 02/28/2021 Requested by: Pearline Cables, MD [address] [place],  [place] [address] PCP: Pearline Cables, MD  Subjective: CC: Follow up, left shoulder pain. New right lower back pain  HPI: 35 year old very active patient presenting to clinic with concerns of ongoing left anterior shoulder pain as well as 2 months of right lower back pain. Per left shoulder pain, patient states that she was working out doing pull-ups, and started to notice pain with overhead motions in the anterior/lateral aspect of her shoulder.  On previous encounter, ultrasound revealed increased fluid signal within the subacromial bursa and she received a subacromial steroid injection.  Unfortunately, patient states that her pain has not improved with this injection and she is worried that there may be something else going on.  She says that her left arm feels weaker than the right, and she feels as though something will "catch" when she reaches overhead.  Denies any pain with biceps curls, and continues to state that the most painful activity is pull-ups, especially with a wide grip. Per right-sided lower back pain, on February 5 she felt as though she pulled a muscle in her lower back.  She initially tried to treat this with conservative ice, stretches, and "babying it" but says that this pain has not improved. No radiation, no weakness/numbess in their legs.               ROS:   All other systems were reviewed and are negative.  Objective: Vital Signs: BP 116/72   Ht 5\' 10"  (1.778 m)   Wt 150 lb (68 kg)   BMI 21.52 kg/m  No flowsheet data found.   No flowsheet data found.  Physical Exam:  General:  Alert and oriented, in no acute distress. Pulm:  Breathing unlabored. Psy:  Normal mood, congruent affect. Skin:  Left shoulder without bruises, rashes, or erythema. Overlying  skin intact.  Left Shoulder Exam:  Inspection: Symmetric muscle mass, no atrophy or deformity, no scars. Palpation: Tenderness to palpation is pin-point over coracoid process on left. Some tenderness over long head of biceps, but this is less significant. No tenderness over AC joint or subacromial area.   Range of motion: Full range of motion in forward flexion, abduction and extension.  Full External rotation to 100 degrees.   Rotator cuff testing:  Full strength and no pain with empty can (supraspinatus).  External rotation with full strength and no pain. Does endorse some discomfort with resisted internal rotation, though strength is preserved.  AC joint testing: No AC tenderness to palpation, negative scarf test.  Biceps testing: Negative speeds, negative Yergason.  Labral testing: O'Brien's/speeds with full strength and no pain. Crank test: Some catching with movement above 90*, accompanied by discomfort, though no obvious clicking.   Impingement testing: No pain with Neer's or Hawkins  Strength testing:  5 out of 5 strength with shoulder abduction (C5), wrist extension (C6), wrist flexion (C7), grip strength (C8), and finger abduction (T1).  Sensation: Intact to light touch throughout bilateral upper extremities.   Brisk distal capillary refill.  BACK EXAMINATION: Normal Gait.  Normal Spinal curvature, without excessive lumbar lordosis, thoracic kyphosis, or scoliosis.  ROM: No Pain with forward flexion or extension. Able to achieve Toe-Touch. No pain with Rotation  or Side-bending.  Palpation: Tenderness to palpation of right lower lumbar muscles, along right iliac crest, and along R obliques. No midline tenderness, no deformity or step-offs. No tenderness over the SI Joints bilaterally. Gluteus musculature and piriformis without tender points. Negative Sacral Spring's test.  Osteopathic Examination: R on L RST. L2-L5 NRRSL  Strength: Hip flexion (L1), Hip Aduction (L2),  Knee Extension (L3) are 5/5 Bilaterally Foot Inversion (L4), Dorsiflexion (L5), and Eversion (S1) 5/5 Bilaterally Sensation: Intact to light touch medial and lateral aspects of lower extremities, and lateral, dorsal, and medial aspects of foot.   Special Tests:  FABER causes No SI Joint Pain Piriformis test: No pain or radiation into leg with hip flexion, adduction SLR: No radiation down Ipilateral or contralateral leg bilaterally Limb Length: Hips Aligned, No obvious discrepancy at medial malleolus    Imaging: Limited extremity US- Left Biceps Long head of Biceps tendon visualized in long and short axis, with no significant surrounding fluid.  Fibers appear intact. Short head of biceps visualized at insertion site of coracoid process. There appears to be a fascial gap between the biceps tendon, subscapularis, and anterior deltoid musculature. This becomes apparent during dynamic internal/external rotation of the shoulder. There is coracoid spurring associated with this, as well as calcifications within the short head biceps tendon.  No significant enlargement of subacromial bursa appreciated.  Impression: Fascial sheering at the short head of the biceps insertion site  Ultrasound and interpretation by Dr. Marga Hoots and Sibyl Parr. Fields, MD    Assessment & Plan: 35 year old very active female presenting to clinic with concerns of persistent left shoulder pain as well as right-sided lower back pain.  Left shoulder pain: Previously noted on ultrasound to have increased fluid within the subacromial bursa, though her symptoms did not improve with subacromial injection.  Examination and ultrasound today concerning for fascial shearing at the insertion of the short head of the biceps, which explains her ongoing symptoms specifically with pull-up exercises.  Discussed that the mechanics of the short head are such that they stabilize the biceps during overhead activity, and suspect that she injured  this during Kip Pull-ups, which she admits to doing frequently prior to the onset of the symptoms. -We will try nitroglycerin patches to help encourage healing of this area. -Avoid overhead activity until her symptoms have improved.  Lower back pain: Appears mechanical in origin.  Likely strain due to vigorous exercise activities.  Home exercise program discussed to help focus on lower back/oblique musculature. -If no improvement in 1 month, return to clinic for reevaluation.  Patient expressed understanding with plan.  She had no further questions or concerns today.  She will return to clinic in 4 to 6 weeks for reevaluation.    Procedures: No procedures performed     I observed and examined the patient with the [facility] resident and agree with assessment and plan.  Note reviewed and modified by me. Sterling Big, MD

## 2021-02-28 NOTE — Patient Instructions (Signed)

## 2021-03-29 ENCOUNTER — Other Ambulatory Visit: Payer: Self-pay

## 2021-03-29 ENCOUNTER — Ambulatory Visit (INDEPENDENT_AMBULATORY_CARE_PROVIDER_SITE_OTHER): Payer: 59 | Admitting: Physician Assistant

## 2021-03-29 ENCOUNTER — Encounter: Payer: Self-pay | Admitting: Physician Assistant

## 2021-03-29 VITALS — BP 113/77 | HR 59 | Ht 71.0 in | Wt 150.0 lb

## 2021-03-29 DIAGNOSIS — F3281 Premenstrual dysphoric disorder: Secondary | ICD-10-CM | POA: Diagnosis not present

## 2021-03-29 DIAGNOSIS — F509 Eating disorder, unspecified: Secondary | ICD-10-CM | POA: Diagnosis not present

## 2021-03-29 DIAGNOSIS — F332 Major depressive disorder, recurrent severe without psychotic features: Secondary | ICD-10-CM

## 2021-03-29 MED ORDER — BUPROPION HCL ER (XL) 150 MG PO TB24: 150.0000 mg | ORAL_TABLET | Freq: Every day | ORAL | 1 refills | Status: DC
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
## 2021-03-30 DIAGNOSIS — F332 Major depressive disorder, recurrent severe without psychotic features: Secondary | ICD-10-CM | POA: Insufficient documentation

## 2021-03-30 DIAGNOSIS — F3281 Premenstrual dysphoric disorder: Secondary | ICD-10-CM | POA: Insufficient documentation

## 2021-04-04 ENCOUNTER — Ambulatory Visit: Payer: 59 | Admitting: Sports Medicine

## 2021-04-20 ENCOUNTER — Other Ambulatory Visit: Payer: Self-pay | Admitting: Physician Assistant

## 2021-04-23 ENCOUNTER — Ambulatory Visit: Payer: 59 | Admitting: Sports Medicine

## 2021-05-07 ENCOUNTER — Ambulatory Visit: Payer: 59 | Admitting: Sports Medicine

## 2021-05-07 ENCOUNTER — Other Ambulatory Visit: Payer: Self-pay

## 2021-05-07 DIAGNOSIS — M25512 Pain in left shoulder: Secondary | ICD-10-CM

## 2021-05-07 DIAGNOSIS — G8929 Other chronic pain: Secondary | ICD-10-CM

## 2021-05-07 MED ORDER — NITROGLYCERIN 0.2 MG/HR TD PT24
MEDICATED_PATCH | TRANSDERMAL | 1 refills | Status: DC
Start: 2021-05-07 — End: 2021-09-04

## 2021-05-07 NOTE — Patient Instructions (Addendum)
It was great to see you today! Thank you for letting me participate in your care!  Today, we discussed your left shoulder that is getting somewhat better but not fully better. Continue using the nitro patches as the partial tear to the small head of the biceps will eventually heal and get better. Avoid aggravating activities.  For your low back this seems like a fascial tear of the muscles that insert to your iliac crest. Please do the stretches we discuss and use nitro patches on that as well. Use the same protocol for nitro patches on your low back as you use for your shoulder.  We will see you back in 4 weeks!  Nitroglycerin Protocol  Apply 1/4 nitroglycerin patch to affected area daily. Change position of patch within the affected area every 24 hours. You may experience a headache during the first 1-2 weeks of using the patch, these should subside. If you experience headaches after beginning nitroglycerin patch treatment, you may take your preferred over the counter pain reliever. Another side effect of the nitroglycerin patch is skin irritation or rash related to patch adhesive. Please notify our office if you develop more severe headaches or rash, and stop the patch. Tendon healing with nitroglycerin patch may require 12 to 24 weeks depending on the extent of injury. Men should not use if taking Viagra, Cialis, or Levitra.  Do not use if you have migraines or rosacea.    Be well, Jules Schick, DO PGY-4, Sports Medicine Fellow [facility]

## 2021-05-07 NOTE — Assessment & Plan Note (Signed)
Known tear of the short head of the biceps found on ultrasound - Cont with relative rest and rehab avoiding aggravating activities - Cont with nitro patches - F/u in 4 weeks

## 2021-05-07 NOTE — Progress Notes (Signed)
SUBJECTIVE:   CHIEF COMPLAINT / HPI:   Left Shoulder F/u Patient is following up today for right shoulder pain that has been ongoing since an injury in November. She had an ultrasound that found a tear of the short head of the biceps. She has been doing some rehab and using nitro patches. She is still having difficulty with any overhead activity and cannot do any heavy lifting overhead such as [facility]. She cannot do any dumbell chest flys. Otherwise, she feels like she has some improvement. Pain is still located over the anterior shoulder.  Low Back Pain Injury occurred in February but got better with rest, ice, stretches, and OTC medications with activity modifications. She thought it was better but twisted in a funny way a few days ago and felt like the pain has returned. It is located on the right lower back and is tender. Radiates down the lateral side of her right leg but does not cause knee pain. No burning, tingling, numbness radiating down the leg. No perceived weakness of the right leg. Made worse with certain movements.  PERTINENT  PMH / PSH: None  OBJECTIVE:   BP 101/68   Ht 5\' 11"  (1.803 m)   Wt 150 lb (68 kg)   BMI 20.92 kg/m   No flowsheet data found.  Shoulder, Left: No evidence of bony deformity, asymmetry, or muscle atrophy; No tenderness over long head of biceps (bicipital groove). TTP at the anterior shoulder most TTP at the coracoid process. No TTP at [facility] joint. Full active and passive range of motion but pain with flexion and abduction above 90 degrees. Pain with resistance to internal rotation. Thumb to T12 with significant tenderness. Strength 5/5 throughout. No abnormal scapular function observed. Sensation intact. Peripheral pulses intact. Special Tests:   - Crossarm test: NEG - Jobe test: NEG   - Hawkins: Positive   - Neer test: NEG   - Gerber lift-off test: Positive for pain   - Belly press test: Positive   - Drop arm test: NEG  Lumbar spine: -  Inspection: no gross deformity or asymmetry, swelling or ecchymosis. No skin changes - Palpation: No TTP over the spinous processes, paraspinal muscles, or SI joints b/l. TTP over the Iliac crest. Made worse with sidebending and rotation of the lumbar spine. - ROM: full active ROM of the lumbar spine in flexion and extension without pain - Strength: 5/5 strength of lower extremity in L4-S1 nerve root distributions b/l - Neuro: sensation intact in the L4-S1 nerve root distribution b/l, 2+ L4 and S1 reflexes Special Tests:   - Straight Leg Raise test: NEG  - Slump test: NEG  Limited MSK U/S: Right Iliac crest Ultrasound examination in the long and short axis of the iliac crest and the right sided show small hyperechoic change at the point of maximal tenderness along the iliac crest at the fascial connection.  She also had a small fluid accumulation seen best in the short axis view. Impression: Fascial tear at the iliac crest on the right  ASSESSMENT/PLAN:   Left shoulder pain Known tear of the short head of the biceps found on ultrasound - Cont with relative rest and rehab avoiding aggravating activities - Cont with nitro patches - F/u in 4 weeks   Fascial Tear at Iliac Crest -  Patient given home exercise program to work on as well as stretches and relative rest. - Nitro patches for her area of maximal tenderness at the iliac crest same protocol  as for the shoulder - Follow-up in 4 weeks  Arlyce Harman, DO PGY-4, Sports Medicine Fellow [facility] I observed and examined the patient with the [facility] resident and agree with assessment and plan.  Note reviewed and modified by me.  Sterling Big, MD

## 2021-05-30 ENCOUNTER — Encounter: Payer: Self-pay | Admitting: Physician Assistant

## 2021-05-30 ENCOUNTER — Ambulatory Visit (INDEPENDENT_AMBULATORY_CARE_PROVIDER_SITE_OTHER): Payer: 59 | Admitting: Physician Assistant

## 2021-05-30 ENCOUNTER — Other Ambulatory Visit: Payer: Self-pay

## 2021-05-30 DIAGNOSIS — F509 Eating disorder, unspecified: Secondary | ICD-10-CM | POA: Diagnosis not present

## 2021-05-30 DIAGNOSIS — F331 Major depressive disorder, recurrent, moderate: Secondary | ICD-10-CM | POA: Diagnosis not present

## 2021-05-30 DIAGNOSIS — F3281 Premenstrual dysphoric disorder: Secondary | ICD-10-CM | POA: Diagnosis not present

## 2021-05-30 NOTE — Progress Notes (Signed)
[facility]  Patient ID: Ann Hall,  MRN: [id number]  PCP: Pearline Cables, MD  Date of Evaluation: 05/30/2021 Time spent:30 minutes  Chief Complaint:  Chief Complaint   Depression; Follow-up     HISTORY/CURRENT STATUS: HPI For routine med check.  Started Wellbutrin 2 months ago. Has been more 'steady' with her moods, not having big dips like she did. Is very introverted so not sure if she's isolating b/c she's that way anyhow. Feels a little more social. 'There's not a simple answer, but I guess I've been more engaged. I'm not as energized as I'd like to be.' Is working about 15-20 hours a week and is able to do that. She's felt bad for so long so it's hard to know how she should feel. Appetite and weight are stable.  No binging, purging, or calorie restricting.  Not staying in bed quite as much as she did.  Denies suicidal or homicidal thoughts.  The major problem right now is severe PMS.  She has had a problem with this for quite a while but feels that no one has taken her seriously.  We briefly talked about this at the last visit but I was hesitant to start more than 1 treatment at a time.  We both agree we can address that now.    Still gets anxious if triggered by something, but she can deal with it.  It is no worse than it has been.  Denies dizziness, syncope, seizures, numbness, tingling, tremor, tics, unsteady gait, slurred speech, confusion. Denies muscle or joint pain, stiffness, or dystonia.  Individual Medical History/ Review of Systems: Changes? :No   Past medications for mental health diagnoses include: Lexapro started fall of 2019 weaned off last fall d/t side effects of no sex drive.  Allergies: Gabapentin  Current Medications:  Current Outpatient Medications:    buPROPion (WELLBUTRIN XL) 150 MG 24 hr tablet, TAKE 1 TABLET BY MOUTH EVERY DAY, Disp: 90 tablet, Rfl: 0   EPINEPHrine 0.3 mg/0.3 mL IJ SOAJ injection, Inject 0.3 mg into the muscle as  needed for anaphylaxis., Disp: 1 each, Rfl: prn   nitroGLYCERIN (NITRODUR - DOSED IN MG/24 HR) 0.2 mg/hr patch, Use 1/4 patch daily to the affected area., Disp: 30 patch, Rfl: 1   levonorgestrel-ethinyl estradiol (ALESSE) 0.1-20 MG-MCG tablet, Take 1 tablet by mouth daily. (Patient not taking: No sig reported), Disp: 3 Package, Rfl: 4 Medication Side Effects: sexual dysfunction  Family Medical/ Social History: Changes? No  MENTAL HEALTH EXAM:  There were no vitals taken for this visit.There is no height or weight on file to calculate BMI.  General Appearance: Casual and Well Groomed  Eye Contact:  Good  Speech:  Clear and Coherent and Normal Rate  Volume:  Normal  Mood:  Depressed  Affect:  Depressed  Thought Process:  Goal Directed and Descriptions of Associations: Circumstantial  Orientation:  Full (Time, Place, and Person)  Thought Content: Logical   Suicidal Thoughts:  No  Homicidal Thoughts:  No  Memory:  WNL  Judgement:  Good  Insight:  Good  Psychomotor Activity:  Normal  Concentration:  Concentration: Good  Recall:  Good  Fund of Knowledge: Good  Language: Good  Assets:  Desire for Improvement  ADL's:  Intact  Cognition: WNL  Prognosis:  Good    DIAGNOSES:    ICD-10-CM   1. Major depressive disorder, recurrent episode, moderate (HCC)  F33.1     2. PMDD (premenstrual dysphoric disorder)  F32.81  3. Eating disorder, unspecified type  F50.9       Receiving Psychotherapy: Yes  continue counseling with [name]   RECOMMENDATIONS:  PDMP reviewed. No Controlled meds.  I provided 30 minutes of face to face time during this encounter, including time spent before and after the visit in records review, medical decision making, and charting.  I am glad to see her doing some better with the Wellbutrin.  We agree to continue the same dose at least for now but knowing we need to observe closely and may need to increase prior to next visit. We discussed the PMDD.   I initially recommended Zoloft because it is fast acting but also has a lower half-life.  We again discussed the estrogen and serotonin relationship and the need to keep the serotonin elevated the week before menses in order to prevent the severe depression.  Once her menses begins the estrogen increases, as well as serotonin level.  She would need to stop the SSRI once her menses begins.  She has taken Lexapro in the past and tolerated it well.  She wonders if that could be an option, after we discussed adding Zoloft for the few days prior to menses.  She still has a lot of Lexapro left over.  It is fine to try this SSRI.  If it is not effective, then 2 cycles or so then I would recommend changing to Zoloft. Add Lexapro 5 mg, qd approx 7 days before menses, then stop the day she starts her period. Continue Wellbutrin XL 150 mg p.o. every morning. Continue therapy with [name]. Return in 2 months.  [name], PA-C

## 2021-06-04 ENCOUNTER — Ambulatory Visit: Payer: 59 | Admitting: Sports Medicine

## 2021-06-28 NOTE — Telephone Encounter (Signed)
Pt response.

## 2021-07-26 ENCOUNTER — Other Ambulatory Visit: Payer: Self-pay | Admitting: Physician Assistant

## 2021-08-01 ENCOUNTER — Other Ambulatory Visit: Payer: Self-pay

## 2021-08-01 ENCOUNTER — Ambulatory Visit: Payer: 59 | Admitting: Physician Assistant

## 2021-08-01 ENCOUNTER — Encounter: Payer: Self-pay | Admitting: Physician Assistant

## 2021-08-01 DIAGNOSIS — F3341 Major depressive disorder, recurrent, in partial remission: Secondary | ICD-10-CM | POA: Diagnosis not present

## 2021-08-01 DIAGNOSIS — F3281 Premenstrual dysphoric disorder: Secondary | ICD-10-CM

## 2021-08-01 MED ORDER — BUPROPION HCL ER (XL) 150 MG PO TB24: 150.0000 mg | ORAL_TABLET | Freq: Every day | ORAL | 0 refills | Status: DC

## 2021-08-01 NOTE — Progress Notes (Signed)
[facility]  Patient ID: Ann Hall,  MRN: [id number]  PCP: Ann Cables, MD  Date of Evaluation: 08/01/2021  time spent:30 minutes  Chief Complaint:  Chief Complaint   Depression; Follow-up      HISTORY/CURRENT STATUS: HPI For routine med check.  About 60-70% better! Wellbutrin has helped tremendously.  She and her wife recently went to [place] on a hiking trip and had a great time.  She is surprised that she was able to do that.  Even 4 or 5 months ago she would not have imagined having the energy or the desire to do something like that and would not have enjoyed it even if she had been able to.  Ann Hall's wife and friend have discussed things with her, how they think she is doing on the current medication.  Her wife says "why not shoot for the stars" meaning that she is doing so much better but maybe she could be doing even better if the dose was increased.  The patient is a little nervous about increasing.  Not sure what we would do if it helped but we ended up maximizing the dose and then it does not work anymore.   Energy and motivation are so much better.  Work is going well.  She sleeps well most of the time.  Does not complain of any anxiety.  She still has the problem of PMDD and she has taken the Lexapro 5 mg on 2 separate cycles now.  It did help both times prevent the "low from being so low" as in the past.  She struggles a bit knowing when to start taking the Lexapro because her menses go anywhere from 31 to 38 days, she may start feeling severe PMS symptoms anywhere from 5 to 10 days before she starts her period. Appetite has not changed.  No extreme sadness, tearfulness, or feelings of hopelessness.  Denies any changes in concentration, making decisions or remembering things.  Denies suicidal or homicidal thoughts.  Denies dizziness, syncope, seizures, numbness, tingling, tremor, tics, unsteady gait, slurred speech, confusion. Denies muscle or joint pain,  stiffness, or dystonia.  Individual Medical History/ Review of Systems: Changes? :No   Past medications for mental health diagnoses include: Lexapro started fall of 2019 weaned off last fall d/t side effects of no sex drive.  Allergies: Gabapentin  Current Medications:  Current Outpatient Medications:    EPINEPHrine 0.3 mg/0.3 mL IJ SOAJ injection, Inject 0.3 mg into the muscle as needed for anaphylaxis., Disp: 1 each, Rfl: prn   escitalopram (LEXAPRO) 10 MG tablet, Take 5 mg by mouth daily. Only a week before menses. Stop once her period starts., Disp: , Rfl:    nitroGLYCERIN (NITRODUR - DOSED IN MG/24 HR) 0.2 mg/hr patch, Use 1/4 patch daily to the affected area., Disp: 30 patch, Rfl: 1   buPROPion (WELLBUTRIN XL) 150 MG 24 hr tablet, Take 1 tablet (150 mg total) by mouth daily., Disp: 90 tablet, Rfl: 0   levonorgestrel-ethinyl estradiol (ALESSE) 0.1-20 MG-MCG tablet, Take 1 tablet by mouth daily. (Patient not taking: No sig reported), Disp: 3 Package, Rfl: 4 Medication Side Effects: sexual dysfunction  Family Medical/ Social History: Changes? No  MENTAL HEALTH EXAM:  There were no vitals taken for this visit.There is no height or weight on file to calculate BMI.  General Appearance: Casual and Well Groomed  Eye Contact:  Good  Speech:  Clear and Coherent and Normal Rate  Volume:  Normal  Mood:  Euthymic  Affect:  Anxious and only when discussing whether to increase the dose of Wellbutrin or not.  Thought Process:  Goal Directed and Descriptions of Associations: Circumstantial  Orientation:  Full (Time, Place, and Person)  Thought Content: Logical   Suicidal Thoughts:  No  Homicidal Thoughts:  No  Memory:  WNL  Judgement:  Good  Insight:  Good  Psychomotor Activity:  Normal  Concentration:  Concentration: Good  Recall:  Good  Fund of Knowledge: Good  Language: Good  Assets:  Desire for Improvement  ADL's:  Intact  Cognition: WNL  Prognosis:  Good    DIAGNOSES:     ICD-10-CM   1. Depression, major, recurrent, in partial remission (HCC)  F33.41     2. PMDD (premenstrual dysphoric disorder)  F32.81        Receiving Psychotherapy: Yes  continue counseling with Ann Hall   RECOMMENDATIONS:  PDMP reviewed.  No results available. I provided 30 minutes of face to face time during this encounter, including time spent before and after the visit in records review, medical decision making, and charting.  Discussed increasing the Wellbutrin.  I think we can get her to at least 75% better or even higher.  I would recommend going ahead and increasing to 300 mg.  She is hesitant and wants to think about it before changing.  I am sending in a 90-day supply so she can increase the Wellbutrin up to 300 mg on her own but I have asked her to call to let me know she is doing that.  She will run out of the medication sooner of course and I will need to send in a new prescription. We discussed the PMDD again.  Since she has such a long menstrual cycle taking the Lexapro for 7 to 10 days should not be a problem, I understand it is difficult to know when to start the Lexapro though.  Recommend starting it when she starts feeling more irritable, weepy, fatigued or depressed.  But definitely needs to stop it when she starts her menses or it will no longer be effective for the PMDD. Continue Lexapro 5 mg, qd approx 7 days before menses, then stop the day she starts her period. Continue Wellbutrin XL 150 mg p.o. every morning. Continue therapy with Ann Hall. Return in 3 months.  Ann Overly, PA-C

## 2021-08-10 ENCOUNTER — Other Ambulatory Visit: Payer: Self-pay

## 2021-08-13 ENCOUNTER — Ambulatory Visit: Payer: Self-pay

## 2021-08-13 ENCOUNTER — Ambulatory Visit: Payer: 59 | Admitting: Sports Medicine

## 2021-08-13 VITALS — Ht 71.0 in | Wt 145.0 lb

## 2021-08-13 DIAGNOSIS — G5752 Tarsal tunnel syndrome, left lower limb: Secondary | ICD-10-CM | POA: Diagnosis not present

## 2021-08-13 DIAGNOSIS — M76891 Other specified enthesopathies of right lower limb, excluding foot: Secondary | ICD-10-CM

## 2021-08-13 DIAGNOSIS — M25551 Pain in right hip: Secondary | ICD-10-CM

## 2021-08-13 DIAGNOSIS — M216X2 Other acquired deformities of left foot: Secondary | ICD-10-CM | POA: Diagnosis not present

## 2021-08-13 DIAGNOSIS — M216X1 Other acquired deformities of right foot: Secondary | ICD-10-CM | POA: Diagnosis not present

## 2021-08-13 NOTE — Assessment & Plan Note (Signed)
Patient has had chronic symptoms concerning the right hip She has resolved the weakness but still does not have lessening of pain I suspect this indicates that she is overly stressing this area and her continued workouts particularly those that are on one leg  Plan is a series of stretches for the piriformis area Continued maintenance of hip exercises Stop 1 leg exercises on the right hip Trial of nitroglycerin one half patch at the insertion near the greater trochanter for piriformis  Recheck 4 to 6 weeks

## 2021-08-13 NOTE — Telephone Encounter (Signed)
I haven't been able to reach her

## 2021-08-13 NOTE — Assessment & Plan Note (Signed)
I believe her exam suggests that her tarsal tunnel syndrome is flaring bilaterally This may indicate a breakdown of her orthotics I really padded those today to see if it would give her some relief I wanted to test this for month but probably make her new pair with more cushion  She will try some Arnica gel over the tibial nerve areas that are so tender  Return 1 month

## 2021-08-13 NOTE — Progress Notes (Signed)
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  Chronic right hip pain This is persisted for several years She is not sure of the original injury but it may have been with some heavy squats Currently she gets pain on 1 leg squats and other 1 leg exercises The pain is more lateral and posterior hips is difficult to sleep on that side  Past history Her chronic depression was severely worsened by gabapentin She felt suicidal on that  Review of systems No sciatica No weakness  Physical exam Thin but very fit white nonbinary person in no acute distress Ht 5\' 11"  (1.803 m)   Wt 145 lb (65.8 kg)   BMI 20.22 kg/m  [facility] 08/13/2021  Frequency of aerobic exercise (# of days/week) 4  Average time in minutes 50  Frequency of strengthening activities (# of days/week) 4   Right hip shows full range of motion She has excellent strength on hip abduction, rotation flexion and extension She has point tenderness that is somewhat global extending from the iliac crest down to the greater trochanter She also has tenderness deep over her hip rotators particular in the piriformis area  Bilateral feet There is some loss of the longitudinal arch with pronation bilaterally There is mild splaying between toes 2 and 3 bilaterally There is a very positive Tinel's behind her medial malleolus over the tibial nerve bilaterally No significant tenderness along the bottom of either foot  Ultrasound of right hip Scan of the gluteus medius  and minimus and long axis and short axis reveals some minor changes near the insertion to the greater trochanter There is no sign of a tear Iliac crest appears normal Scan of the hip rotators reveals that the piriformis muscle looks very hypoechoic No tears are noted Other muscle areas look normal  Impression: Probable chronic strain of the piriformis and the hip abductors near the insertion to the greater trochanter based on ultrasound changes.  This corresponds with her area of maximal tenderness.  Ultrasound and interpretation by 10/13/2021. Sibyl Parr, MD

## 2021-08-13 NOTE — Patient Instructions (Addendum)
It was great to see you today!  -You can start using Arnica gel several times a day. -Continue using the nitroglycerin patches -Continue doing your stretches -Follow up in 1 month -Call us before then if you decide you like the changes we made to your orthotics today and you'd like to get a new pair made

## 2021-08-20 NOTE — Telephone Encounter (Signed)
Pt LVM to advise that the script Ann Hall wrote for Wellbutrin was for 90 days, but her insurance will only cover 30 days at a time, unless she goes through a mail order service.  Pls call her to discuss.

## 2021-08-21 MED ORDER — BUPROPION HCL ER (XL) 150 MG PO TB24: 150.0000 mg | ORAL_TABLET | Freq: Every day | ORAL | 0 refills | Status: DC

## 2021-09-04 ENCOUNTER — Other Ambulatory Visit: Payer: Self-pay

## 2021-09-04 MED ORDER — NITROGLYCERIN 0.2 MG/HR TD PT24: MEDICATED_PATCH | TRANSDERMAL | 1 refills | Status: DC

## 2021-09-12 ENCOUNTER — Ambulatory Visit (INDEPENDENT_AMBULATORY_CARE_PROVIDER_SITE_OTHER): Payer: 59 | Admitting: Sports Medicine

## 2021-09-12 DIAGNOSIS — M76891 Other specified enthesopathies of right lower limb, excluding foot: Secondary | ICD-10-CM | POA: Diagnosis not present

## 2021-09-12 DIAGNOSIS — G5752 Tarsal tunnel syndrome, left lower limb: Secondary | ICD-10-CM | POA: Diagnosis not present

## 2021-09-12 NOTE — Assessment & Plan Note (Signed)
Chronic but now showing some improvement with NTG and HEP.  Must avoid certain more painful lifts.  Reck this in 2 mos

## 2021-09-12 NOTE — Progress Notes (Signed)
PCP: Pearline Cables, MD  Subjective:   HPI: Patient is a 35 y.o. adult here for to follow up on R hip pain and bilateral foot pain.  R hip pain: Hip pain has been improving over the past couple weeks.  Ann Hall has been resting, Is a 40% improvement. Still cannot do single leg exercises on the right that includes squats and abduction.  Unable to do deadlifts either.  Currently using half a nitroglycerin patch, but is unsure if it is helping.  Ann Hall is overall pleased with her progress thus far  Bilateral foot pain: Ann Hall was fitted for orthotics at the last visit, but was only able to wear them for about a week and a half.  Ann Hall was starting to have bilateral lateral foot pain, with the left being worse than the right.  Continues to have bilateral nerve pain left greater than right, for which Ann Hall gel and heat is giving minimal improvement.  Ann Hall states that the foot pain is consistently above a 3.  Past Medical History:  Diagnosis Date   Abnormal Pap smear of cervix 2008   Depression    Dysmenorrhea    PTSD (post-traumatic stress disorder)     Current Outpatient Medications on File Prior to Visit  Medication Sig Dispense Refill   buPROPion (WELLBUTRIN XL) 150 MG 24 hr tablet Take 1 tablet (150 mg total) by mouth daily. 90 tablet 0   EPINEPHrine 0.3 mg/0.3 mL IJ SOAJ injection Inject 0.3 mg into the muscle as needed for anaphylaxis. 1 each prn   escitalopram (LEXAPRO) 10 MG tablet Take 5 mg by mouth daily. Only a week before menses. Stop once her period starts.     levonorgestrel-ethinyl estradiol (ALESSE) 0.1-20 MG-MCG tablet Take 1 tablet by mouth daily. (Patient not taking: No sig reported) 3 Package 4   nitroGLYCERIN (NITRODUR - DOSED IN MG/24 HR) 0.2 mg/hr patch Use 1/4 patch daily to the affected area. 30 patch 1   No current facility-administered medications on file prior to visit.    Past Surgical History:  Procedure Laterality Date   COLPOSCOPY  2008   ? unsure of  results   WISDOM TOOTH EXTRACTION Bilateral 2007    Allergies  Allergen Reactions   Gabapentin Other (See Comments)    suicidal    BP 104/72   Ht 5\' 10"  (1.778 m)   Wt 145 lb (65.8 kg)   BMI 20.81 kg/m   [facility] 08/13/2021 09/12/2021  Frequency of aerobic exercise (# of days/week) 4 4  Average time in minutes 50 50  Frequency of strengthening activities (# of days/week) 4 4    No flowsheet data found.      Objective:  Physical Exam:  Gen: NAD, comfortable in exam room HEENT: Normocephalic, atraumatic Respiratory: No acute respiratory distress, breathing and speaking comfortably Psych: Normal mood, thought content MSK: -Right hip: 5/5 strength in abduction with recruitment of both gluteus and TFL, full AROM -Feet: Loss of transverse arch bilaterally with some pronation, mild splaying between second and third toes, and third and fourth toes, TTP over the tibial nerve bilaterally   Assessment & Plan:  1.  Bilateral tarsal tunnel -Adjusted orthotics today by removing the scaphoid pad, and placing a thinner pad at the medial heel to displace pressure off the tibial nerve traversing through the tarsal tunnel -Continue B6 supplementation, Ann Hall gel, and heat for burning foot pain -Monitor pain and modify activity accordingly, should not have pain level consistently above a  3 with activity -F/u in 2-3 months to assess for improvement in symptoms  Orland Jarred, DO Family Medicine, PGY-3 I observed and examined the patient with the resident and agree with assessment and plan.  Note reviewed and modified by me. Sterling Big, MD

## 2021-09-12 NOTE — Assessment & Plan Note (Signed)
This is bilateral now We are making adjustments to orthotics See how this modification does with reducing pain

## 2021-09-16 ENCOUNTER — Telehealth: Payer: Self-pay | Admitting: Physician Assistant

## 2021-09-16 NOTE — Telephone Encounter (Signed)
Pt called and said that her sports medicine dr. Dewayne Hatch to prescribe amotriptyline for her nerve in her foot. She wants to make sure it is ok to take this med since it is an anti depressent. Please give her a call at [phone]

## 2021-09-17 NOTE — Telephone Encounter (Signed)
Any reason she shouldn't take amitriptyline?

## 2021-09-17 NOTE — Telephone Encounter (Signed)
No, it's ok to take it with the Wellbutrin and Lexapro.

## 2021-09-18 NOTE — Telephone Encounter (Signed)
Left detailed message with information and to call back with further questions. 

## 2021-09-27 ENCOUNTER — Other Ambulatory Visit: Payer: Self-pay | Admitting: Physician Assistant

## 2021-09-27 ENCOUNTER — Telehealth: Payer: Self-pay | Admitting: Physician Assistant

## 2021-09-27 MED ORDER — BUPROPION HCL ER (XL) 300 MG PO TB24: 300.0000 mg | ORAL_TABLET | Freq: Every morning | ORAL | 0 refills | Status: DC

## 2021-09-27 NOTE — Telephone Encounter (Signed)
Please give her a call and let her know I will send in a prescription for the 300 mg just so a prescription is available for her.  Does this need to go to [facility]?  I am glad she is doing okay with the increase. Thanks.

## 2021-09-27 NOTE — Telephone Encounter (Signed)
Patient requested to use [facility] and is aware of new Rx being sent.

## 2021-09-27 NOTE — Telephone Encounter (Signed)
FYI  You sent 90-day supply of 150 mg on 10/12.

## 2021-09-27 NOTE — Telephone Encounter (Signed)
Prescription was sent

## 2021-09-27 NOTE — Telephone Encounter (Signed)
LVM for patient to RC.

## 2021-09-27 NOTE — Telephone Encounter (Signed)
Ann Hall called to report that she did increase her Wellbutrin to 300mg  this week.  She is doing fine with the increase.  She will need an early refill since she has doubled up on the medication.  She doesn't need it yet though.  She will call back when she needs for you to send the new prescription. Just wanted you to be aware that she increase her dose.

## 2021-10-08 ENCOUNTER — Encounter: Payer: Self-pay | Admitting: Sports Medicine

## 2021-10-31 ENCOUNTER — Other Ambulatory Visit: Payer: Self-pay

## 2021-10-31 ENCOUNTER — Ambulatory Visit: Payer: 59 | Admitting: Physician Assistant

## 2021-10-31 ENCOUNTER — Encounter: Payer: Self-pay | Admitting: Physician Assistant

## 2021-10-31 DIAGNOSIS — F3341 Major depressive disorder, recurrent, in partial remission: Secondary | ICD-10-CM | POA: Diagnosis not present

## 2021-10-31 DIAGNOSIS — F3281 Premenstrual dysphoric disorder: Secondary | ICD-10-CM

## 2021-10-31 NOTE — Progress Notes (Signed)
[facility]  Patient ID: Ann Hall,  MRN: [id number]  PCP: Pearline Cables, MD  Date of Evaluation:10/31/2021  time spent:30 minutes  Chief Complaint:  Chief Complaint   Depression; Follow-up     HISTORY/CURRENT STATUS: HPI For routine med check.  October was hard. Hardest in 10-11 months or so. She took a week off work to take care of herself, she didn't go to therapy at all, b/c she had to use all her energy to go to work. But in Nov, she did make sure she went to therapy. Wellbutrin was increased 09/27/2021, now on 300 mg for about 5 weeks. She can tell it's helping some.  Feels more energetic and motivated although not as much as she would like to be.  She is able to work without as much difficulty though.  She is not isolating.  Does not cry easily.  ADLs and personal hygiene are normal.  Sleeps fine and feels rested when she gets up.  Not having a lot of anxiety.  No suicidal or homicidal thoughts.  PMDD symptoms are much better since she is taking the Lexapro for the week before menses begins.  Sports med provider has suggested amitriptyline for tarsal tunnel syndrome.  She has questions about it from a mental health standpoint.  She knows that it is an antidepressant and wants to make sure it is okay to mix with current treatment.  Patient denies increased energy with decreased need for sleep, no increased talkativeness, no racing thoughts, no impulsivity or risky behaviors, no increased spending, no increased libido, no grandiosity, no increased irritability or anger, and no hallucinations.  Denies dizziness, syncope, seizures, numbness, tingling, tremor, tics, unsteady gait, slurred speech, confusion. Denies muscle or joint pain, stiffness, or dystonia.  Individual Medical History/ Review of Systems: Changes? :No   Past medications for mental health diagnoses include: Lexapro started fall of 2019 weaned off last fall d/t side effects of no sex  drive.  Allergies: Gabapentin  Current Medications:  Current Outpatient Medications:    buPROPion (WELLBUTRIN XL) 300 MG 24 hr tablet, Take 1 tablet (300 mg total) by mouth in the morning., Disp: 90 tablet, Rfl: 0   EPINEPHrine 0.3 mg/0.3 mL IJ SOAJ injection, Inject 0.3 mg into the muscle as needed for anaphylaxis., Disp: 1 each, Rfl: prn   escitalopram (LEXAPRO) 10 MG tablet, Take 5 mg by mouth daily. Only a week before menses. Stop once her period starts., Disp: , Rfl:    levonorgestrel-ethinyl estradiol (ALESSE) 0.1-20 MG-MCG tablet, Take 1 tablet by mouth daily. (Patient not taking: No sig reported), Disp: 3 Package, Rfl: 4   nitroGLYCERIN (NITRODUR - DOSED IN MG/24 HR) 0.2 mg/hr patch, Use 1/4 patch daily to the affected area. (Patient not taking: Reported on 10/31/2021), Disp: 30 patch, Rfl: 1 Medication Side Effects: sexual dysfunction  Family Medical/ Social History: Changes? No  MENTAL HEALTH EXAM:  There were no vitals taken for this visit.There is no height or weight on file to calculate BMI.  General Appearance: Casual and Well Groomed  Eye Contact:  Good  Speech:  Clear and Coherent and Normal Rate  Volume:  Normal  Mood:  Euthymic  Affect:  Congruent  Thought Process:  Goal Directed and Descriptions of Associations: Circumstantial  Orientation:  Full (Time, Place, and Person)  Thought Content: Logical   Suicidal Thoughts:  No  Homicidal Thoughts:  No  Memory:  WNL  Judgement:  Good  Insight:  Good  Psychomotor Activity:  Normal  Concentration:  Concentration: Good  Recall:  Good  Fund of Knowledge: Good  Language: Good  Assets:  Desire for Improvement  ADL's:  Intact  Cognition: WNL  Prognosis:  Good    DIAGNOSES:    ICD-10-CM   1. Depression, major, recurrent, in partial remission (HCC)  F33.41     2. PMDD (premenstrual dysphoric disorder)  F32.81         Receiving Psychotherapy: Yes  continue counseling with [name]   RECOMMENDATIONS:   PDMP reviewed.  No results available. I provided 30 minutes of face to face time during this encounter, including time spent before and after the visit in records review, medical decision making, counseling pertinent to today's visit, and charting.  Discussed the option of increasing the Wellbutrin but at this point neither of [place] want to change that. We also discussed the amitriptyline, that it would be fine for her to take with current medications.  She is still going to think about it before she takes it. Continue Lexapro 5 mg, qd approx 7 days before menses, then stop the day she starts her period. Continue Wellbutrin XL 300 mg p.o. every morning. Continue therapy with [name]. Return in 6 wks.  [name], PA-C

## 2021-11-14 ENCOUNTER — Ambulatory Visit: Payer: 59 | Admitting: Sports Medicine

## 2021-11-18 DIAGNOSIS — G5753 Tarsal tunnel syndrome, bilateral lower limbs: Secondary | ICD-10-CM | POA: Diagnosis not present

## 2021-11-18 DIAGNOSIS — M25572 Pain in left ankle and joints of left foot: Secondary | ICD-10-CM | POA: Insufficient documentation

## 2021-11-18 DIAGNOSIS — M25571 Pain in right ankle and joints of right foot: Secondary | ICD-10-CM | POA: Insufficient documentation

## 2021-12-03 ENCOUNTER — Other Ambulatory Visit: Payer: Self-pay | Admitting: Physician Assistant

## 2021-12-05 ENCOUNTER — Telehealth: Payer: Self-pay | Admitting: Physician Assistant

## 2021-12-05 ENCOUNTER — Other Ambulatory Visit: Payer: Self-pay

## 2021-12-05 MED ORDER — BUPROPION HCL ER (XL) 300 MG PO TB24: 300.0000 mg | ORAL_TABLET | Freq: Every morning | ORAL | 0 refills | Status: DC

## 2021-12-05 NOTE — Telephone Encounter (Signed)
Spoke to pt

## 2021-12-05 NOTE — Telephone Encounter (Signed)
Patient called to give an update on Bupropion 300mg . She states she is doing well with dosage and wishes to continue at this dosage. She also requested refill for Bupropion. Appt 3/8 Ph: [phone]. [facility] [address]

## 2021-12-05 NOTE — Telephone Encounter (Signed)
[facility] attempted to reach pt to discuss

## 2021-12-12 ENCOUNTER — Ambulatory Visit: Payer: 59 | Admitting: Physician Assistant

## 2022-01-01 DIAGNOSIS — F331 Major depressive disorder, recurrent, moderate: Secondary | ICD-10-CM | POA: Diagnosis not present

## 2022-01-15 ENCOUNTER — Ambulatory Visit: Payer: Self-pay | Admitting: Physician Assistant

## 2022-01-16 DIAGNOSIS — M542 Cervicalgia: Secondary | ICD-10-CM | POA: Diagnosis not present

## 2022-01-16 DIAGNOSIS — F331 Major depressive disorder, recurrent, moderate: Secondary | ICD-10-CM | POA: Diagnosis not present

## 2022-01-16 DIAGNOSIS — M25579 Pain in unspecified ankle and joints of unspecified foot: Secondary | ICD-10-CM | POA: Diagnosis not present

## 2022-01-16 DIAGNOSIS — M47819 Spondylosis without myelopathy or radiculopathy, site unspecified: Secondary | ICD-10-CM | POA: Diagnosis not present

## 2022-01-16 DIAGNOSIS — M549 Dorsalgia, unspecified: Secondary | ICD-10-CM | POA: Diagnosis not present

## 2022-01-29 DIAGNOSIS — M722 Plantar fascial fibromatosis: Secondary | ICD-10-CM | POA: Diagnosis not present

## 2022-01-29 DIAGNOSIS — M25571 Pain in right ankle and joints of right foot: Secondary | ICD-10-CM | POA: Diagnosis not present

## 2022-01-29 DIAGNOSIS — M25572 Pain in left ankle and joints of left foot: Secondary | ICD-10-CM | POA: Diagnosis not present

## 2022-01-29 DIAGNOSIS — M79672 Pain in left foot: Secondary | ICD-10-CM | POA: Diagnosis not present

## 2022-01-29 DIAGNOSIS — M79671 Pain in right foot: Secondary | ICD-10-CM | POA: Diagnosis not present

## 2022-01-29 DIAGNOSIS — M542 Cervicalgia: Secondary | ICD-10-CM | POA: Diagnosis not present

## 2022-01-29 DIAGNOSIS — M47819 Spondylosis without myelopathy or radiculopathy, site unspecified: Secondary | ICD-10-CM | POA: Diagnosis not present

## 2022-02-04 ENCOUNTER — Ambulatory Visit: Payer: BC Managed Care – PPO | Admitting: Physician Assistant

## 2022-02-18 DIAGNOSIS — M542 Cervicalgia: Secondary | ICD-10-CM | POA: Diagnosis not present

## 2022-02-18 DIAGNOSIS — M25512 Pain in left shoulder: Secondary | ICD-10-CM | POA: Diagnosis not present

## 2022-02-18 DIAGNOSIS — M545 Low back pain, unspecified: Secondary | ICD-10-CM | POA: Diagnosis not present

## 2022-02-18 DIAGNOSIS — M25551 Pain in right hip: Secondary | ICD-10-CM | POA: Diagnosis not present

## 2022-02-21 DIAGNOSIS — M25512 Pain in left shoulder: Secondary | ICD-10-CM | POA: Diagnosis not present

## 2022-02-21 DIAGNOSIS — M25551 Pain in right hip: Secondary | ICD-10-CM | POA: Diagnosis not present

## 2022-02-21 DIAGNOSIS — M542 Cervicalgia: Secondary | ICD-10-CM | POA: Diagnosis not present

## 2022-02-21 DIAGNOSIS — M545 Low back pain, unspecified: Secondary | ICD-10-CM | POA: Diagnosis not present

## 2022-02-25 DIAGNOSIS — M545 Low back pain, unspecified: Secondary | ICD-10-CM | POA: Diagnosis not present

## 2022-02-25 DIAGNOSIS — M25551 Pain in right hip: Secondary | ICD-10-CM | POA: Diagnosis not present

## 2022-02-25 DIAGNOSIS — M25512 Pain in left shoulder: Secondary | ICD-10-CM | POA: Diagnosis not present

## 2022-02-25 DIAGNOSIS — M542 Cervicalgia: Secondary | ICD-10-CM | POA: Diagnosis not present

## 2022-02-28 DIAGNOSIS — M545 Low back pain, unspecified: Secondary | ICD-10-CM | POA: Diagnosis not present

## 2022-02-28 DIAGNOSIS — M542 Cervicalgia: Secondary | ICD-10-CM | POA: Diagnosis not present

## 2022-02-28 DIAGNOSIS — M25551 Pain in right hip: Secondary | ICD-10-CM | POA: Diagnosis not present

## 2022-02-28 DIAGNOSIS — M25512 Pain in left shoulder: Secondary | ICD-10-CM | POA: Diagnosis not present

## 2022-03-03 DIAGNOSIS — F331 Major depressive disorder, recurrent, moderate: Secondary | ICD-10-CM | POA: Diagnosis not present

## 2022-03-04 ENCOUNTER — Ambulatory Visit: Payer: BC Managed Care – PPO | Admitting: Physician Assistant

## 2022-03-06 ENCOUNTER — Other Ambulatory Visit: Payer: Self-pay | Admitting: Physician Assistant

## 2022-03-06 NOTE — Telephone Encounter (Signed)
Is patient still following with CR? All appts canceled.  ?

## 2022-03-07 DIAGNOSIS — M25512 Pain in left shoulder: Secondary | ICD-10-CM | POA: Diagnosis not present

## 2022-03-07 DIAGNOSIS — M542 Cervicalgia: Secondary | ICD-10-CM | POA: Diagnosis not present

## 2022-03-07 DIAGNOSIS — M25551 Pain in right hip: Secondary | ICD-10-CM | POA: Diagnosis not present

## 2022-03-07 DIAGNOSIS — M545 Low back pain, unspecified: Secondary | ICD-10-CM | POA: Diagnosis not present

## 2022-03-07 NOTE — Telephone Encounter (Signed)
LVM to [facility] - is she still following with CR.  ?

## 2022-03-07 NOTE — Telephone Encounter (Signed)
Pt called back.  Yes, she is still with CR and would like the Buproprion refill sent in to  ? ?[id number] [place] [place] [facility] [place].  ?[address] [place] [address]  ?Phone:  [phone]  Fax:  [phone]  ?[name] #:  [id number] ? ?She has been trying to get a billing issue fixed from her Dec 2022 visit before she books another appt.  Leda Gauze and [place] had previously advised they would work on it with [employer].  Beth advised she will call her back, but she is waiting on her insurance company. ? ?She doesn't want to book another appt until this is resolved because it will just add to the confusion.   ? ?Pls call her back if you will not fill her script until she has an appt. ?

## 2022-03-13 ENCOUNTER — Telehealth: Payer: Self-pay | Admitting: Physician Assistant

## 2022-03-13 NOTE — Telephone Encounter (Signed)
LVM for patient. She was last seen 10/2021 with RTC in 6 weeks. She cancelled all appts over a payment issue. I let her know we needed her to schedule an appt.  ?

## 2022-03-13 NOTE — Telephone Encounter (Signed)
Pt LVM  @ 11:14a.  She wanted to get 90 days of Wellbutrin but the pharmacy says she only has 60 days worth on file.  She wants to know if we will add another 30 days worth of meds to the script, so she can get a 90 day script. ? ?[facility] [address] ? ? ?No upcoming appts scheduled. ? ?

## 2022-03-14 DIAGNOSIS — M542 Cervicalgia: Secondary | ICD-10-CM | POA: Diagnosis not present

## 2022-03-14 DIAGNOSIS — M25551 Pain in right hip: Secondary | ICD-10-CM | POA: Diagnosis not present

## 2022-03-14 DIAGNOSIS — M25512 Pain in left shoulder: Secondary | ICD-10-CM | POA: Diagnosis not present

## 2022-03-14 DIAGNOSIS — M545 Low back pain, unspecified: Secondary | ICD-10-CM | POA: Diagnosis not present

## 2022-03-20 DIAGNOSIS — M25512 Pain in left shoulder: Secondary | ICD-10-CM | POA: Diagnosis not present

## 2022-03-20 DIAGNOSIS — M542 Cervicalgia: Secondary | ICD-10-CM | POA: Diagnosis not present

## 2022-03-20 DIAGNOSIS — M25551 Pain in right hip: Secondary | ICD-10-CM | POA: Diagnosis not present

## 2022-03-20 DIAGNOSIS — M545 Low back pain, unspecified: Secondary | ICD-10-CM | POA: Diagnosis not present

## 2022-03-28 DIAGNOSIS — M545 Low back pain, unspecified: Secondary | ICD-10-CM | POA: Diagnosis not present

## 2022-03-28 DIAGNOSIS — M542 Cervicalgia: Secondary | ICD-10-CM | POA: Diagnosis not present

## 2022-03-28 DIAGNOSIS — M25551 Pain in right hip: Secondary | ICD-10-CM | POA: Diagnosis not present

## 2022-03-28 DIAGNOSIS — M25512 Pain in left shoulder: Secondary | ICD-10-CM | POA: Diagnosis not present

## 2022-04-01 ENCOUNTER — Other Ambulatory Visit: Payer: Self-pay | Admitting: Physician Assistant

## 2022-04-02 NOTE — Telephone Encounter (Signed)
Patient has a billing dispute and won't make an appt until that is resolved. Last refill I only gave #60 to allow time to make an appt. She called and asked for a corrected refill for 90 days and I told her she needed to make an appt. Still no appt.

## 2022-04-09 ENCOUNTER — Other Ambulatory Visit: Payer: Self-pay | Admitting: Physician Assistant

## 2022-04-09 DIAGNOSIS — F331 Major depressive disorder, recurrent, moderate: Secondary | ICD-10-CM | POA: Diagnosis not present

## 2022-04-11 DIAGNOSIS — M25512 Pain in left shoulder: Secondary | ICD-10-CM | POA: Diagnosis not present

## 2022-04-11 DIAGNOSIS — M25551 Pain in right hip: Secondary | ICD-10-CM | POA: Diagnosis not present

## 2022-04-11 DIAGNOSIS — M542 Cervicalgia: Secondary | ICD-10-CM | POA: Diagnosis not present

## 2022-04-11 DIAGNOSIS — M545 Low back pain, unspecified: Secondary | ICD-10-CM | POA: Diagnosis not present

## 2022-04-15 ENCOUNTER — Other Ambulatory Visit: Payer: Self-pay | Admitting: Physician Assistant

## 2022-05-08 DIAGNOSIS — F331 Major depressive disorder, recurrent, moderate: Secondary | ICD-10-CM | POA: Diagnosis not present

## 2022-05-22 DIAGNOSIS — F331 Major depressive disorder, recurrent, moderate: Secondary | ICD-10-CM | POA: Diagnosis not present
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
## 2022-06-04 ENCOUNTER — Ambulatory Visit: Payer: BC Managed Care – PPO | Admitting: Family Medicine

## 2022-06-04 VITALS — BP 110/62 | HR 60 | Temp 97.9°F | Resp 18 | Ht 71.0 in | Wt 138.8 lb

## 2022-06-04 DIAGNOSIS — S161XXA Strain of muscle, fascia and tendon at neck level, initial encounter: Secondary | ICD-10-CM

## 2022-06-04 DIAGNOSIS — F331 Major depressive disorder, recurrent, moderate: Secondary | ICD-10-CM | POA: Diagnosis not present
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
## 2022-06-08 NOTE — Patient Instructions (Addendum)
Good to see you again today- I will be in touch with your labs and pap asap  I gave you an rx for prednisone- can start if not seeing improvement in the next few days Let me know if any questions!  I sent a message to a surgeon for you- will let you know what they say!

## 2022-06-09 DIAGNOSIS — M542 Cervicalgia: Secondary | ICD-10-CM | POA: Diagnosis not present

## 2022-06-13 DIAGNOSIS — M542 Cervicalgia: Secondary | ICD-10-CM | POA: Diagnosis not present

## 2022-06-16 ENCOUNTER — Ambulatory Visit (INDEPENDENT_AMBULATORY_CARE_PROVIDER_SITE_OTHER): Payer: BC Managed Care – PPO | Admitting: Family Medicine

## 2022-06-16 ENCOUNTER — Encounter: Payer: Self-pay | Admitting: Family Medicine

## 2022-06-16 ENCOUNTER — Other Ambulatory Visit (HOSPITAL_COMMUNITY)
Admission: RE | Admit: 2022-06-16 | Discharge: 2022-06-16 | Disposition: A | Discharge status: Home / Self Care | Payer: BC Managed Care – PPO | Source: Ambulatory Visit | Attending: Family Medicine | Admitting: Family Medicine

## 2022-06-16 VITALS — BP 100/70 | HR 87 | Temp 97.8°F | Resp 18 | Ht 70.0 in | Wt 139.0 lb

## 2022-06-16 DIAGNOSIS — Z1329 Encounter for screening for other suspected endocrine disorder: Secondary | ICD-10-CM

## 2022-06-16 DIAGNOSIS — Z13220 Encounter for screening for lipoid disorders: Secondary | ICD-10-CM

## 2022-06-16 DIAGNOSIS — Z0000 Encounter for general adult medical examination without abnormal findings: Secondary | ICD-10-CM | POA: Diagnosis not present

## 2022-06-16 DIAGNOSIS — Z130 Encounter for screening for diseases of the blood and blood-forming organs and certain disorders involving the immune mechanism: Secondary | ICD-10-CM | POA: Diagnosis not present

## 2022-06-16 DIAGNOSIS — S161XXA Strain of muscle, fascia and tendon at neck level, initial encounter: Secondary | ICD-10-CM

## 2022-06-16 DIAGNOSIS — Z124 Encounter for screening for malignant neoplasm of cervix: Secondary | ICD-10-CM | POA: Diagnosis not present

## 2022-06-16 DIAGNOSIS — Z131 Encounter for screening for diabetes mellitus: Secondary | ICD-10-CM

## 2022-06-16 DIAGNOSIS — R4689 Other symptoms and signs involving appearance and behavior: Secondary | ICD-10-CM

## 2022-06-16 LAB — COMPREHENSIVE METABOLIC PANEL
ALT: 10 U/L (ref 0–35)
AST: 16 U/L (ref 0–37)
Albumin: 4.4 g/dL (ref 3.5–5.2)
Alkaline Phosphatase: 62 U/L (ref 39–117)
BUN: 17 mg/dL (ref 6–23)
CO2: 24 mEq/L (ref 19–32)
Calcium: 8.9 mg/dL (ref 8.4–10.5)
Chloride: 102 mEq/L (ref 96–112)
Creatinine, Ser: 0.98 mg/dL (ref 0.40–1.20)
GFR: 73.42 mL/min (ref >60.00)
Glucose, Bld: 88 mg/dL (ref 70–99)
Potassium: 3.9 mEq/L (ref 3.5–5.1)
Sodium: 140 mEq/L (ref 135–145)
Total Bilirubin: 0.8 mg/dL (ref 0.2–1.2)
Total Protein: 6.9 g/dL (ref 6.0–8.3)

## 2022-06-16 LAB — LIPID PANEL
Cholesterol: 132 mg/dL (ref 0–200)
HDL: 63.1 mg/dL (ref >39.00)
LDL Cholesterol: 59 mg/dL (ref 0–99)
NonHDL: 68.52
Total CHOL/HDL Ratio: 2
Triglycerides: 49 mg/dL (ref 0.0–149.0)
VLDL: 9.8 mg/dL (ref 0.0–40.0)

## 2022-06-16 LAB — CBC
HCT: 36.8 % (ref 36.0–46.0)
Hemoglobin: 12.5 g/dL (ref 12.0–15.0)
MCHC: 33.9 g/dL (ref 30.0–36.0)
MCV: 92.6 fl (ref 78.0–100.0)
Platelets: 203 10*3/uL (ref 150.0–400.0)
RBC: 3.97 Mil/uL (ref 3.87–5.11)
RDW: 12.9 % (ref 11.5–15.5)
WBC: 5.4 10*3/uL (ref 4.0–10.5)

## 2022-06-16 LAB — HEMOGLOBIN A1C: Hgb A1c MFr Bld: 5.4 % (ref 4.6–6.5)

## 2022-06-16 LAB — TSH: TSH: 0.94 u[IU]/mL (ref 0.35–5.50)

## 2022-06-16 MED ORDER — PREDNISONE 20 MG PO TABS: ORAL_TABLET | ORAL | 0 refills | Status: DC

## 2022-06-18 ENCOUNTER — Encounter: Payer: Self-pay | Admitting: Family Medicine

## 2022-06-18 DIAGNOSIS — M25551 Pain in right hip: Secondary | ICD-10-CM | POA: Diagnosis not present

## 2022-06-18 DIAGNOSIS — M542 Cervicalgia: Secondary | ICD-10-CM | POA: Diagnosis not present

## 2022-06-18 DIAGNOSIS — M545 Low back pain, unspecified: Secondary | ICD-10-CM | POA: Diagnosis not present

## 2022-06-18 DIAGNOSIS — M25512 Pain in left shoulder: Secondary | ICD-10-CM | POA: Diagnosis not present

## 2022-06-18 DIAGNOSIS — F331 Major depressive disorder, recurrent, moderate: Secondary | ICD-10-CM | POA: Diagnosis not present

## 2022-06-18 LAB — CYTOLOGY - PAP
Comment: NEGATIVE
Diagnosis: NEGATIVE
High risk HPV: NEGATIVE

## 2022-06-20 DIAGNOSIS — M542 Cervicalgia: Secondary | ICD-10-CM | POA: Diagnosis not present

## 2022-07-02 DIAGNOSIS — M25512 Pain in left shoulder: Secondary | ICD-10-CM | POA: Diagnosis not present

## 2022-07-02 DIAGNOSIS — M25551 Pain in right hip: Secondary | ICD-10-CM | POA: Diagnosis not present

## 2022-07-02 DIAGNOSIS — M545 Low back pain, unspecified: Secondary | ICD-10-CM | POA: Diagnosis not present

## 2022-07-02 DIAGNOSIS — M542 Cervicalgia: Secondary | ICD-10-CM | POA: Diagnosis not present

## 2022-07-03 DIAGNOSIS — F331 Major depressive disorder, recurrent, moderate: Secondary | ICD-10-CM | POA: Diagnosis not present

## 2022-07-04 DIAGNOSIS — M542 Cervicalgia: Secondary | ICD-10-CM | POA: Diagnosis not present

## 2022-07-09 ENCOUNTER — Encounter: Payer: Self-pay | Admitting: Family Medicine

## 2022-07-11 DIAGNOSIS — M542 Cervicalgia: Secondary | ICD-10-CM | POA: Diagnosis not present

## 2022-07-15 ENCOUNTER — Other Ambulatory Visit: Payer: Self-pay | Admitting: Physician Assistant

## 2022-07-16 DIAGNOSIS — M25551 Pain in right hip: Secondary | ICD-10-CM | POA: Diagnosis not present

## 2022-07-16 DIAGNOSIS — M542 Cervicalgia: Secondary | ICD-10-CM | POA: Diagnosis not present

## 2022-07-16 DIAGNOSIS — M545 Low back pain, unspecified: Secondary | ICD-10-CM | POA: Diagnosis not present

## 2022-07-16 DIAGNOSIS — M25512 Pain in left shoulder: Secondary | ICD-10-CM | POA: Diagnosis not present

## 2022-07-17 ENCOUNTER — Other Ambulatory Visit: Payer: Self-pay | Admitting: Physician Assistant

## 2022-07-17 DIAGNOSIS — F331 Major depressive disorder, recurrent, moderate: Secondary | ICD-10-CM | POA: Diagnosis not present

## 2022-07-18 NOTE — Telephone Encounter (Signed)
LVM for pt to find another pharmacy with med

## 2022-07-21 NOTE — Telephone Encounter (Signed)
Noted  

## 2022-07-21 NOTE — Telephone Encounter (Signed)
[facility] XL 300 in [facility] and are currently not able to get it from their manufacturers.  I called [facility] on [address] and they have it in stock. I called patient to ask if she wanted Rx sent to another pharmacy or a different formulation. She said she would discuss with you at her appt on Wednesday what she wanted to do.

## 2022-07-23 ENCOUNTER — Encounter: Payer: Self-pay | Admitting: Physician Assistant

## 2022-07-23 ENCOUNTER — Ambulatory Visit: Payer: BC Managed Care – PPO | Admitting: Physician Assistant

## 2022-07-23 DIAGNOSIS — F3341 Major depressive disorder, recurrent, in partial remission: Secondary | ICD-10-CM | POA: Diagnosis not present

## 2022-07-23 DIAGNOSIS — F3281 Premenstrual dysphoric disorder: Secondary | ICD-10-CM | POA: Diagnosis not present

## 2022-07-23 MED ORDER — BUPROPION HCL ER (XL) 300 MG PO TB24
300.0000 mg | ORAL_TABLET | Freq: Every day | ORAL | 1 refills | Status: DC
Start: 2022-07-23 — End: 2023-01-22

## 2022-07-23 MED ORDER — MODAFINIL 100 MG PO TABS: 50.0000 mg | ORAL_TABLET | Freq: Every day | ORAL | 0 refills | Status: DC | PRN

## 2022-07-23 NOTE — Progress Notes (Signed)
[facility]  Patient ID: Ann Hall,  MRN: [id number]  PCP: Pearline Cables, MD  Date of Evaluation:07/23/2022 time spent:20 minutes  Chief Complaint:  Chief Complaint   Depression; Follow-up    HISTORY/CURRENT STATUS: HPI For routine med check.  Working only 8-12 hours/wk. states she is unable to work more than that because she is unable to take care of herself which causes more fatigue due to depression.  She has to take several days doing nothing before and after she works to reenergize in order to go back to work.  The Wellbutrin has helped.  "It is doing its job."  When she is not at work she does yoga, meditates, takes walks, reads, swims, goes to the gym, takes care of their 2 dogs and 2 cats, which helps her mood and sometimes her energy as well.  She has recently started the process of applying for disability.  Does not feel that she can continue to work with the way she feels.    Her best friend is getting married in October and she asked if there is anything she can do medication wise to give her enough energy to enjoy the festivities.  She has always been very hesitant to take any kind of medication and will try not to take anything for that, but it would be nice to know there is an option.  She plans to experiment with green tea or some natural source for energy before she relies on a pill.   Energy and motivation are fair most of the time.  No extreme sadness, tearfulness, or feelings of hopelessness.  Sleeps well.  ADLs and personal hygiene are normal.   Denies any changes in concentration, making decisions, or remembering things.  Appetite has not changed, occasionally when she has more depression she does not eat as much as normal.  She is aware of that and make sure she gets plenty of nutrients during that time.  Weight is stable.  Not anxious most of the time.  When she does not feel like going to work sometimes she is nervous until she gets there and gets  going.  PMS symptoms are controlled.  Denies suicidal or homicidal thoughts.  Patient denies increased energy with decreased need for sleep, increased talkativeness, racing thoughts, impulsivity or risky behaviors, increased spending, increased libido, grandiosity, increased irritability or anger, paranoia, or hallucinations.  Denies dizziness, syncope, seizures, numbness, tingling, tremor, tics, unsteady gait, slurred speech, confusion. Denies muscle or joint pain, stiffness, or dystonia.  Individual Medical History/ Review of Systems: Changes? :No   Past medications for mental health diagnoses include: Lexapro started fall of 2019 weaned off last fall d/t side effects of no sex drive.  Allergies: Gabapentin  Current Medications:  Current Outpatient Medications:    EPINEPHrine 0.3 mg/0.3 mL IJ SOAJ injection, Inject 0.3 mg into the muscle as needed for anaphylaxis., Disp: 1 each, Rfl: prn   escitalopram (LEXAPRO) 10 MG tablet, Take 5 mg by mouth daily. Only a week before menses. Stop once her period starts., Disp: , Rfl:    modafinil (PROVIGIL) 100 MG tablet, Take 0.5-1 tablets (50-100 mg total) by mouth daily as needed., Disp: 10 tablet, Rfl: 0   buPROPion (WELLBUTRIN XL) 300 MG 24 hr tablet, Take 1 tablet (300 mg total) by mouth daily., Disp: 90 tablet, Rfl: 1 Medication Side Effects: sexual dysfunction  Family Medical/ Social History: Changes? No  MENTAL HEALTH EXAM:  There were no vitals taken for this visit.There is  no height or weight on file to calculate BMI.  General Appearance: Casual and Well Groomed  Eye Contact:  Good  Speech:  Clear and Coherent and Normal Rate  Volume:  Normal  Mood:  Euthymic  Affect:  Congruent  Thought Process:  Goal Directed and Descriptions of Associations: Circumstantial  Orientation:  Full (Time, Place, and Person)  Thought Content: Logical   Suicidal Thoughts:  No  Homicidal Thoughts:  No  Memory:  WNL  Judgement:  Good  Insight:  Good   Psychomotor Activity:  Normal  Concentration:  Concentration: Good and Attention Span: Good  Recall:  Good  Fund of Knowledge: Good  Language: Good  Assets:  Desire for Improvement  ADL's:  Intact  Cognition: WNL  Prognosis:  Good   DIAGNOSES:    ICD-10-CM   1. Depression, major, recurrent, in partial remission (HCC)  F33.41     2. PMDD (premenstrual dysphoric disorder)  F32.81       Receiving Psychotherapy: Yes  continue counseling with [name]  RECOMMENDATIONS:  PDMP reviewed.  No results available. I provided 20 minutes of face to face time during this encounter, including time spent before and after the visit in records review, medical decision making, counseling pertinent to today's visit, and charting.   We discussed the situation.  Recommend modafinil to use as needed for the wedding that she will be attending.  She will try it first at home to see how it affects her and then can use during the time of the wedding if it is effective.  Discussed the depression.  She has always been very hesitant to take medications, the Wellbutrin has helped.  We could increase the dose but she is concerned that it may cause anxiety.  She had labs recently which were within normal limits (see on chart.)  So no concerns of anemia, hypothyroidism, metabolic syndrome.  We agreed to leave her medications the same.  Continue Lexapro 5 mg, qd approx 7 days before menses, then stop the day she starts her period. Continue Wellbutrin XL 300 mg p.o. every morning. Continue therapy with [name]. Return in 6 months.  [name], PA-C

## 2022-07-29 DIAGNOSIS — M542 Cervicalgia: Secondary | ICD-10-CM | POA: Diagnosis not present

## 2022-08-08 DIAGNOSIS — F331 Major depressive disorder, recurrent, moderate: Secondary | ICD-10-CM | POA: Diagnosis not present

## 2022-08-21 DIAGNOSIS — F331 Major depressive disorder, recurrent, moderate: Secondary | ICD-10-CM | POA: Diagnosis not present

## 2022-08-26 DIAGNOSIS — F331 Major depressive disorder, recurrent, moderate: Secondary | ICD-10-CM | POA: Diagnosis not present

## 2022-09-03 DIAGNOSIS — F331 Major depressive disorder, recurrent, moderate: Secondary | ICD-10-CM | POA: Diagnosis not present

## 2022-09-11 DIAGNOSIS — F331 Major depressive disorder, recurrent, moderate: Secondary | ICD-10-CM | POA: Diagnosis not present

## 2022-09-25 DIAGNOSIS — M542 Cervicalgia: Secondary | ICD-10-CM | POA: Diagnosis not present

## 2022-10-06 DIAGNOSIS — M542 Cervicalgia: Secondary | ICD-10-CM | POA: Diagnosis not present

## 2022-10-15 DIAGNOSIS — F331 Major depressive disorder, recurrent, moderate: Secondary | ICD-10-CM | POA: Diagnosis not present

## 2022-10-28 DIAGNOSIS — M25551 Pain in right hip: Secondary | ICD-10-CM | POA: Diagnosis not present

## 2022-10-28 DIAGNOSIS — M542 Cervicalgia: Secondary | ICD-10-CM | POA: Diagnosis not present

## 2022-10-28 DIAGNOSIS — M545 Low back pain, unspecified: Secondary | ICD-10-CM | POA: Diagnosis not present

## 2022-10-28 DIAGNOSIS — M25512 Pain in left shoulder: Secondary | ICD-10-CM | POA: Diagnosis not present

## 2022-10-29 DIAGNOSIS — F331 Major depressive disorder, recurrent, moderate: Secondary | ICD-10-CM | POA: Diagnosis not present

## 2022-11-13 DIAGNOSIS — F331 Major depressive disorder, recurrent, moderate: Secondary | ICD-10-CM | POA: Diagnosis not present

## 2022-11-26 DIAGNOSIS — F331 Major depressive disorder, recurrent, moderate: Secondary | ICD-10-CM | POA: Diagnosis not present

## 2022-12-11 DIAGNOSIS — F331 Major depressive disorder, recurrent, moderate: Secondary | ICD-10-CM | POA: Diagnosis not present

## 2022-12-16 DIAGNOSIS — F640 Transsexualism: Secondary | ICD-10-CM | POA: Diagnosis not present

## 2022-12-25 DIAGNOSIS — F331 Major depressive disorder, recurrent, moderate: Secondary | ICD-10-CM | POA: Diagnosis not present

## 2023-01-08 DIAGNOSIS — F331 Major depressive disorder, recurrent, moderate: Secondary | ICD-10-CM | POA: Diagnosis not present

## 2023-01-21 DIAGNOSIS — F331 Major depressive disorder, recurrent, moderate: Secondary | ICD-10-CM | POA: Diagnosis not present

## 2023-01-22 ENCOUNTER — Ambulatory Visit: Payer: BC Managed Care – PPO | Admitting: Physician Assistant

## 2023-01-22 ENCOUNTER — Encounter: Payer: Self-pay | Admitting: Physician Assistant

## 2023-01-22 DIAGNOSIS — F3281 Premenstrual dysphoric disorder: Secondary | ICD-10-CM

## 2023-01-22 DIAGNOSIS — F331 Major depressive disorder, recurrent, moderate: Secondary | ICD-10-CM

## 2023-01-22 DIAGNOSIS — F509 Eating disorder, unspecified: Secondary | ICD-10-CM

## 2023-01-22 MED ORDER — BUPROPION HCL ER (XL) 150 MG PO TB24: 450.0000 mg | ORAL_TABLET | Freq: Every day | ORAL | 0 refills | Status: DC

## 2023-01-22 NOTE — Progress Notes (Signed)
[facility]  Patient ID: [name],  MRN: [id number]  PCP: Darreld Mclean, MD  Date of Evaluation: 01/22/2023. time spent:20 minutes  Chief Complaint:  Chief Complaint   Follow-up; Depression    HISTORY/CURRENT STATUS: HPI For routine med check.  Feels stable. Was talking with her therapist yesterday who asked her to gauge what she meant by feeling well with her current medications.  Patient stated she was not suicidal.  Her therapist wondered if she would be able to enjoy life more with possibly tweaking meds or something. Ann Hall stated she wants to have options if she does become suicidal again so she has never thought about changing her medication.  The Wellbutrin has really helped in that regard.  She used to enjoy playing basketball, spending time with friends, having dinner parties, things like that but all that changed in probably 2017 and has gradually declined.  She has a very close best friend lives only a few miles from her, they are still close but instead of seeing each other daily, it usually every 2 to 3 weeks now.  She understands mental health issues so it is not a problem, although [name] would like to be able to do some of those things without being so drained.  She still only works 6 to 7 hours a week.  It is not a hardship on her and her wife as she makes enough money to support them.  But it is draining if she works any more than that.  "I am existing, and glad I am not suicidal like I have been in the past."  ADLs and personal hygiene are normal.  Appetite is normal and weight is stable.  She is not crying easily.  No complaints of anxiety unless it is triggered by social situations or something.  She sleeps well. No c/o anxiety unless there's a known trigger. PMDD is better with the Lexapro  prior to her period.  No suicidal or homicidal thoughts.  Patient denies increased energy with decreased need for sleep, increased talkativeness, racing thoughts,  impulsivity or risky behaviors, increased spending, increased libido, grandiosity, increased irritability or anger, paranoia, or hallucinations.  Denies dizziness, syncope, seizures, numbness, tingling, tremor, tics, unsteady gait, slurred speech, confusion. Denies muscle or joint pain, stiffness, or dystonia.  Individual Medical History/ Review of Systems: Changes? :Yes   saw surgeon last month, consultation for mastectomy  Past medications for mental health diagnoses include: Lexapro started fall of 2019 weaned off last fall d/t side effects of no sex drive.  Allergies: Gabapentin  Current Medications:  Current Outpatient Medications:    buPROPion (WELLBUTRIN XL) 150 MG 24 hr tablet, Take 3 tablets (450 mg total) by mouth daily., Disp: 270 tablet, Rfl: 0   escitalopram (LEXAPRO) 10 MG tablet, Take 5 mg by mouth daily. Only the week before her menses., Disp: , Rfl:    EPINEPHrine 0.3 mg/0.3 mL IJ SOAJ injection, Inject 0.3 mg into the muscle as needed for anaphylaxis. (Patient not taking: Reported on 01/22/2023), Disp: 1 each, Rfl: prn   modafinil (PROVIGIL) 100 MG tablet, Take 0.5-1 tablets (50-100 mg total) by mouth daily as needed. (Patient not taking: Reported on 01/22/2023), Disp: 10 tablet, Rfl: 0 Medication Side Effects: sexual dysfunction  Family Medical/ Social History: Changes? No  MENTAL HEALTH EXAM:  There were no vitals taken for this visit.There is no height or weight on file to calculate BMI.  General Appearance: Casual and Well Groomed  Eye Contact:  Good  Speech:  Clear and Coherent and Normal Rate  Volume:  Normal  Mood:  Euthymic  Affect:  Congruent  Thought Process:  Goal Directed and Descriptions of Associations: Circumstantial  Orientation:  Full (Time, Place, and Person)  Thought Content: Logical   Suicidal Thoughts:  No  Homicidal Thoughts:  No  Memory:  WNL  Judgement:  Good  Insight:  Good  Psychomotor Activity:  Normal  Concentration:  Concentration:  Good and Attention Span: Good  Recall:  Good  Fund of Knowledge: Good  Language: Good  Assets:  Desire for Improvement Financial Resources/Insurance Housing Transportation  ADL's:  Intact  Cognition: WNL  Prognosis:  Good   DIAGNOSES:    ICD-10-CM   1. Major depressive disorder, recurrent episode, moderate (HCC)  F33.1     2. PMDD (premenstrual dysphoric disorder)  F32.81     3. Eating disorder, unspecified type  F50.9       Receiving Psychotherapy: Yes  continue counseling with Anderson Malta  RECOMMENDATIONS:  PDMP reviewed.  Modafinil filled 07/29/2022. (She never took) I provided 20 minutes of face to face time during this encounter, including time spent before and after the visit in records review, medical decision making, counseling pertinent to today's visit, and charting.   Discussed the depression. I recommend increasing the Wellbutrin, it should help with energy and motivation so she can get back to enjoying things like she used to.   Increase Wellbutrin XL to 150 mg, 3 qd. Continue Lexapro 5 mg, qd approx 7 days before menses, then stop the day she starts her period. Continue therapy with Anderson Malta. Return in 6 weeks.   Donnal Moat, PA-C

## 2023-01-28 ENCOUNTER — Telehealth: Payer: Self-pay | Admitting: Physician Assistant

## 2023-01-28 NOTE — Telephone Encounter (Signed)
Patient last seen 3/14. She called requesting RF of Lexapro that has been prescribed by another provider and now wants you to take over prescribing. I can send in, just want your ok before doing so.   Per last note:  Continue Lexapro 5 mg, qd approx 7 days before menses, then stop the day she starts her period.

## 2023-01-28 NOTE — Telephone Encounter (Signed)
Pt called and would like lexapro 10 mg sent to [facility] on [address]. She was getting it from a different doctor and now wants to get it from teresa

## 2023-01-29 ENCOUNTER — Other Ambulatory Visit: Payer: Self-pay | Admitting: Physician Assistant

## 2023-01-29 MED ORDER — ESCITALOPRAM OXALATE 10 MG PO TABS: 5.0000 mg | ORAL_TABLET | Freq: Every day | ORAL | 1 refills | Status: DC

## 2023-01-29 NOTE — Telephone Encounter (Signed)
Rx was sent  

## 2023-02-05 DIAGNOSIS — F331 Major depressive disorder, recurrent, moderate: Secondary | ICD-10-CM | POA: Diagnosis not present

## 2023-02-12 DIAGNOSIS — F331 Major depressive disorder, recurrent, moderate: Secondary | ICD-10-CM | POA: Diagnosis not present

## 2023-02-26 DIAGNOSIS — F331 Major depressive disorder, recurrent, moderate: Secondary | ICD-10-CM | POA: Diagnosis not present

## 2023-03-05 ENCOUNTER — Ambulatory Visit: Payer: BC Managed Care – PPO | Admitting: Physician Assistant

## 2023-03-05 DIAGNOSIS — F331 Major depressive disorder, recurrent, moderate: Secondary | ICD-10-CM | POA: Diagnosis not present

## 2023-03-12 ENCOUNTER — Encounter: Payer: Self-pay | Admitting: Physician Assistant

## 2023-03-12 ENCOUNTER — Ambulatory Visit: Payer: BC Managed Care – PPO | Admitting: Physician Assistant

## 2023-03-12 DIAGNOSIS — F509 Eating disorder, unspecified: Secondary | ICD-10-CM | POA: Diagnosis not present

## 2023-03-12 DIAGNOSIS — F3281 Premenstrual dysphoric disorder: Secondary | ICD-10-CM | POA: Diagnosis not present

## 2023-03-12 MED ORDER — BUPROPION HCL ER (XL) 150 MG PO TB24: 450.0000 mg | ORAL_TABLET | Freq: Every day | ORAL | 3 refills | Status: DC

## 2023-03-12 MED ORDER — ESCITALOPRAM OXALATE 10 MG PO TABS: 5.0000 mg | ORAL_TABLET | Freq: Every day | ORAL | 1 refills | Status: DC

## 2023-03-12 NOTE — Progress Notes (Signed)
[facility]  Patient ID: Ann Hall,  MRN: [id number]  PCP: Pearline Cables, MD  Date of Evaluation: 03/12/2023 time spent:20 minutes  Chief Complaint:  Chief Complaint   Depression; Follow-up    HISTORY/CURRENT STATUS: HPI For routine med check.  Increased Wellbutrin 6 weeks ago. Feels a little more motivated and energetic, but definitely depends on the day. Still has a hard time enjoying things.  Still works part-time.  No extreme sadness, tearfulness, or feelings of hopelessness.  Sleeps well most of the time. ADLs and personal hygiene are normal.   Denies any changes in concentration, making decisions, or remembering things.  Appetite has not changed.  Weight is stable.  No calorie restricting, binging/purging or laxative use.  No anxiety most of the time. Denies suicidal or homicidal thoughts.  It's hard to say how much the Lexapro is helping, only takes about 5 days before her period starts. Her sx of severe PMDD start usually around 10-12 days before menses.   Patient denies increased energy with decreased need for sleep, increased talkativeness, racing thoughts, impulsivity or risky behaviors, increased spending, increased libido, grandiosity, increased irritability or anger, paranoia, or hallucinations.  Denies dizziness, syncope, seizures, numbness, tingling, tremor, tics, unsteady gait, slurred speech, confusion. Denies muscle or joint pain, stiffness, or dystonia.  Individual Medical History/ Review of Systems: Changes? :No      Past medications for mental health diagnoses include: Lexapro started fall of 2019 weaned off last fall d/t side effects of no sex drive.  Allergies: Gabapentin   Current Medications:  Current Outpatient Medications:    buPROPion (WELLBUTRIN XL) 150 MG 24 hr tablet, Take 3 tablets (450 mg total) by mouth daily., Disp: 270 tablet, Rfl: 3   EPINEPHrine 0.3 mg/0.3 mL IJ SOAJ injection, Inject 0.3 mg into the muscle as needed for  anaphylaxis. (Patient not taking: Reported on 01/22/2023), Disp: 1 each, Rfl: prn   escitalopram (LEXAPRO) 10 MG tablet, Take 0.5 tablets (5 mg total) by mouth daily. Only the week before her menses, stop when period starts., Disp: 90 tablet, Rfl: 1 Medication Side Effects: sexual dysfunction  Family Medical/ Social History: Changes? No  MENTAL HEALTH EXAM:  There were no vitals taken for this visit.There is no height or weight on file to calculate BMI.  General Appearance: Casual and Well Groomed  Eye Contact:  Good  Speech:  Clear and Coherent and Normal Rate  Volume:  Normal  Mood:  Euthymic  Affect:  Congruent  Thought Process:  Goal Directed and Descriptions of Associations: Circumstantial  Orientation:  Full (Time, Place, and Person)  Thought Content: Logical   Suicidal Thoughts:  No  Homicidal Thoughts:  No  Memory:  WNL  Judgement:  Good  Insight:  Good  Psychomotor Activity:  Normal  Concentration:  Concentration: Good and Attention Span: Good  Recall:  Good  Fund of Knowledge: Good  Language: Good  Assets:  Communication Skills Desire for Improvement Financial Resources/Insurance Housing Transportation  ADL's:  Intact  Cognition: WNL  Prognosis:  Good   DIAGNOSES:    ICD-10-CM   1. PMDD (premenstrual dysphoric disorder)  F32.81     2. Eating disorder, unspecified type  F50.9       Receiving Psychotherapy: Yes  continue counseling with [name]  RECOMMENDATIONS:  PDMP reviewed.  Modafinil filled 07/29/2022. (She never took) I provided 20 minutes of face to face time during this encounter, including time spent before and after the visit in records review, medical decision  making, counseling pertinent to today's visit, and charting.   Disc PMDD. Recommend beginning Lexapro a few days earlier that the typical 5-7 days prior to menses. If that's not effective, will ask her to see GYN.  Continue Wellbutrin XL 150 mg, 3 qd. Continue Lexapro 5 mg, qd  beginning around 12 days before menses, then stop the day she starts her period. Continue therapy with [name]. Return in 2-3 mo (after at least 2 menstrual cycles)  [name], PA-C

## 2023-03-25 ENCOUNTER — Encounter: Payer: Self-pay | Admitting: Family Medicine

## 2023-03-25 DIAGNOSIS — F649 Gender identity disorder, unspecified: Secondary | ICD-10-CM | POA: Diagnosis not present

## 2023-03-26 DIAGNOSIS — F331 Major depressive disorder, recurrent, moderate: Secondary | ICD-10-CM | POA: Diagnosis not present

## 2023-04-09 DIAGNOSIS — F331 Major depressive disorder, recurrent, moderate: Secondary | ICD-10-CM | POA: Diagnosis not present

## 2023-04-22 DIAGNOSIS — F331 Major depressive disorder, recurrent, moderate: Secondary | ICD-10-CM | POA: Diagnosis not present

## 2023-05-04 DIAGNOSIS — F331 Major depressive disorder, recurrent, moderate: Secondary | ICD-10-CM | POA: Diagnosis not present

## 2023-05-28 ENCOUNTER — Ambulatory Visit: Payer: BC Managed Care – PPO | Admitting: Physician Assistant

## 2023-05-30 NOTE — Progress Notes (Unsigned)
[facility] [address] [place], [place] [address] [phone]  Date:  06/04/2023   Name:  Ann Hall   DOB:  [birth year]   MRN:  [id number]  PCP:  Ann Cables, MD    Chief Complaint: No chief complaint on file.   History of Present Illness:  Ann Hall is a 37 y.o. very pleasant adult patient who presents with the following:  Patient seen today for physical exam Most recent visit with myself was about 1 year ago, in August  Tetanus may be due for update Pap smear due 2026  Recently, [name] has been seen by plastic surgery at [facility] and has planned a bilateral mastectomy for gender affirmation-it looks like surgery is planned for September of this year  Can update blood work today Also consider EKG, I may need to clear them for surgery  Patient Active Problem List   Diagnosis Date Noted   Left shoulder pain 05/07/2021   Severe episode of recurrent major depressive disorder, without psychotic features (HCC) 03/30/2021   PMDD (premenstrual dysphoric disorder) 03/30/2021   Pain in joint, ankle and foot 08/19/2019   Piriformis muscle pain 09/02/2018   Neck muscle strain, initial encounter 09/02/2018   Pes anserine bursitis 07/22/2018   Tarsal tunnel syndrome of left side 06/10/2018   Pronation deformity of both feet 06/10/2018   Neuritis of ankle, left 12/11/2017   Plantar fasciitis of left foot 12/11/2017   Hip abductor tendinitis . wekaness, right 07/15/2017   Plantar fasciitis of right foot 07/15/2017    Past Medical History:  Diagnosis Date   Abnormal Pap smear of cervix 2008   Depression    Dysmenorrhea    PTSD (post-traumatic stress disorder)     Past Surgical History:  Procedure Laterality Date   COLPOSCOPY  2008   ? unsure of results   WISDOM TOOTH EXTRACTION Bilateral 2007    Social History   Tobacco Use   Smoking status: Never   Smokeless tobacco: Never  Substance Use Topics    Alcohol use: Not Currently    Alcohol/week: 0.0 - 1.0 standard drinks of alcohol   Drug use: No    Family History  Problem Relation Age of Onset   Thyroid disease Mother    Thyroid disease Sister    Breast cancer Maternal Grandmother 87   Stroke Maternal Grandmother    Pernicious anemia Maternal Grandmother    Pancreatic cancer Maternal Grandfather    Lymphoma Maternal Grandfather    Heart attack Maternal Grandfather    Heart attack Paternal Grandfather    Healthy Father     Allergies  Allergen Reactions   Gabapentin Other (See Comments)    suicidal    Medication list has been reviewed and updated.  Current Outpatient Medications on File Prior to Visit  Medication Sig Dispense Refill   buPROPion (WELLBUTRIN XL) 150 MG 24 hr tablet Take 3 tablets (450 mg total) by mouth daily. 270 tablet 3   EPINEPHrine 0.3 mg/0.3 mL IJ SOAJ injection Inject 0.3 mg into the muscle as needed for anaphylaxis. (Patient not taking: Reported on 01/22/2023) 1 each prn   escitalopram (LEXAPRO) 10 MG tablet Take 0.5 tablets (5 mg total) by mouth daily. Only the week before her menses, stop when period starts. 90 tablet 1   No current facility-administered medications on file prior to visit.    Review of Systems:  As per HPI- otherwise negative.  Physical Examination: There were no vitals filed for this visit. There were no vitals filed for this visit. There is no height or weight on file to calculate BMI. Ideal Body Weight:    GEN: no acute distress. HEENT: Atraumatic, Normocephalic.  Ears and Nose: No external deformity. CV: RRR, No M/G/R. No JVD. No thrill. No extra heart sounds. PULM: CTA B, no wheezes, crackles, rhonchi. No retractions. No resp. distress. No accessory muscle use. ABD: S, NT, ND, +BS. No rebound. No HSM. EXTR: No c/c/e PSYCH: Normally interactive. Conversant.    Assessment and Plan: *** Physical exam today, encouraged healthy diet and exercise routine Will  plan further follow- up pending labs.  Signed Abbe Amsterdam, MD

## 2023-05-30 NOTE — Patient Instructions (Incomplete)
It was great to see you again, I will be in touch with labs

## 2023-06-01 ENCOUNTER — Encounter: Payer: Self-pay | Admitting: Physician Assistant

## 2023-06-01 ENCOUNTER — Ambulatory Visit (INDEPENDENT_AMBULATORY_CARE_PROVIDER_SITE_OTHER): Payer: BC Managed Care – PPO | Admitting: Physician Assistant

## 2023-06-01 DIAGNOSIS — F649 Gender identity disorder, unspecified: Secondary | ICD-10-CM

## 2023-06-01 DIAGNOSIS — F3341 Major depressive disorder, recurrent, in partial remission: Secondary | ICD-10-CM

## 2023-06-01 DIAGNOSIS — F3281 Premenstrual dysphoric disorder: Secondary | ICD-10-CM

## 2023-06-01 MED ORDER — SERTRALINE HCL 50 MG PO TABS: 25.0000 mg | ORAL_TABLET | Freq: Every day | ORAL | 1 refills | Status: DC

## 2023-06-01 NOTE — Progress Notes (Signed)
[facility]  Patient ID: Ann Hall,  MRN: [id number]  PCP: Pearline Cables, MD  Date of Evaluation: 06/01/2023 time spent:20 minutes  Chief Complaint:  Chief Complaint   Depression    HISTORY/CURRENT STATUS: HPI For routine med check.  PMDD is better w/ the Lexapro.  She has had 2 menstrual cycles since our last visit, so she can tell her symptoms are better than they used to be but she wonders if it could be even better.   Her mood is pretty stable.  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  She is able to enjoy some things and is just happy that she feels like getting out of the house now whereas in the past she would not do anything.  She is planning to have bilateral mastectomy for gender affirmation in early September.  ADLs and personal hygiene are normal.  Appetite is normal and weight is stable.  She does not cry easily.  Her memory is good and at her baseline.  No anxiety to speak of unless she has to go out of the house and do something that is not in her comfort zone.  Denies suicidal or homicidal thoughts.  Patient denies increased energy with decreased need for sleep, increased talkativeness, racing thoughts, impulsivity or risky behaviors, increased spending, increased libido, grandiosity, increased irritability or anger, paranoia, or hallucinations.  Denies dizziness, syncope, seizures, numbness, tingling, tremor, tics, unsteady gait, slurred speech, confusion. Denies muscle or joint pain, stiffness, or dystonia.  Individual Medical History/ Review of Systems: Changes? :No      Past medications for mental health diagnoses include: Lexapro started fall of 2019 weaned off last fall d/t side effects of no sex drive.  Allergies: Gabapentin   Current Medications:  Current Outpatient Medications:    buPROPion (WELLBUTRIN XL) 150 MG 24 hr tablet, Take 3 tablets (450 mg total) by mouth daily., Disp: 270 tablet, Rfl: 3   sertraline  (ZOLOFT) 50 MG tablet, Take 0.5 tablets (25 mg total) by mouth daily. Take 10 days before menses. Stop once menses begins, Disp: 30 tablet, Rfl: 1   EPINEPHrine 0.3 mg/0.3 mL IJ SOAJ injection, Inject 0.3 mg into the muscle as needed for anaphylaxis., Disp: 1 each, Rfl: prn Medication Side Effects: sexual dysfunction  Family Medical/ Social History: Changes? No  MENTAL HEALTH EXAM:  There were no vitals taken for this visit.There is no height or weight on file to calculate BMI.  General Appearance: Casual and Well Groomed  Eye Contact:  Good  Speech:  Clear and Coherent and Normal Rate  Volume:  Normal  Mood:  Euthymic  Affect:  Congruent  Thought Process:  Goal Directed and Descriptions of Associations: Circumstantial  Orientation:  Full (Time, Place, and Person)  Thought Content: Logical   Suicidal Thoughts:  No  Homicidal Thoughts:  No  Memory:  WNL  Judgement:  Good  Insight:  Good  Psychomotor Activity:  Normal  Concentration:  Concentration: Good and Attention Span: Good  Recall:  Good  Fund of Knowledge: Good  Language: Good  Assets:  Communication Skills Desire for Improvement Financial Resources/Insurance Housing Social Support Transportation  ADL's:  Intact  Cognition: WNL  Prognosis:  Good   DIAGNOSES:    ICD-10-CM   1. PMDD (premenstrual dysphoric disorder)  F32.81     2. Gender dysphoria  F64.9     3. Depression, major, recurrent, in partial remission (HCC)  F33.41  Receiving Psychotherapy: Yes  continue counseling with [name]  RECOMMENDATIONS:  PDMP reviewed.  Modafinil filled 07/29/2022. (She never took) I provided  20 minutes of face to face time during this encounter, including time spent before and after the visit in records review, medical decision making, counseling pertinent to today's visit, and charting.   Taking the Lexapro premenstrually has been helpful but I would like to try Zoloft instead.  It seems to work quicker and the  half-life shorter, therefore it is more helpful for some women with PMDD.  She would like to try it.  D/c lexapro  Continue Wellbutrin XL 150 mg, 3 qd. Start Zoloft 50 mg, 1/2 pill daily for 7 to 10 days premenstrually.  Stop when menses begins. Continue therapy with [name]. Return in early October, after she has the mastectomy.  [name], PA-C

## 2023-06-04 ENCOUNTER — Encounter: Payer: Self-pay | Admitting: Family Medicine

## 2023-06-04 ENCOUNTER — Ambulatory Visit (INDEPENDENT_AMBULATORY_CARE_PROVIDER_SITE_OTHER): Payer: BC Managed Care – PPO | Admitting: Family Medicine

## 2023-06-04 VITALS — BP 120/60 | HR 67 | Temp 98.1°F | Resp 18 | Ht 70.0 in | Wt 135.6 lb

## 2023-06-04 DIAGNOSIS — R5383 Other fatigue: Secondary | ICD-10-CM | POA: Diagnosis not present

## 2023-06-04 DIAGNOSIS — Z0000 Encounter for general adult medical examination without abnormal findings: Secondary | ICD-10-CM | POA: Diagnosis not present

## 2023-06-04 DIAGNOSIS — Z3141 Encounter for fertility testing: Secondary | ICD-10-CM

## 2023-06-04 DIAGNOSIS — Z130 Encounter for screening for diseases of the blood and blood-forming organs and certain disorders involving the immune mechanism: Secondary | ICD-10-CM

## 2023-06-04 DIAGNOSIS — Z131 Encounter for screening for diabetes mellitus: Secondary | ICD-10-CM | POA: Diagnosis not present

## 2023-06-04 DIAGNOSIS — Z1329 Encounter for screening for other suspected endocrine disorder: Secondary | ICD-10-CM

## 2023-06-04 DIAGNOSIS — Z13220 Encounter for screening for lipoid disorders: Secondary | ICD-10-CM | POA: Diagnosis not present

## 2023-06-04 DIAGNOSIS — Z01810 Encounter for preprocedural cardiovascular examination: Secondary | ICD-10-CM

## 2023-06-04 LAB — CBC
HCT: 39.5 % (ref 36.0–46.0)
Hemoglobin: 12.9 g/dL (ref 12.0–15.0)
MCHC: 32.6 g/dL (ref 30.0–36.0)
MCV: 93.9 fl (ref 78.0–100.0)
Platelets: 278 10*3/uL (ref 150.0–400.0)
RBC: 4.21 Mil/uL (ref 3.87–5.11)
RDW: 13.1 % (ref 11.5–15.5)
WBC: 6.5 10*3/uL (ref 4.0–10.5)

## 2023-06-04 LAB — LIPID PANEL
Cholesterol: 141 mg/dL (ref 0–200)
HDL: 64.7 mg/dL (ref >39.00)
LDL Cholesterol: 61 mg/dL (ref 0–99)
NonHDL: 76.24
Total CHOL/HDL Ratio: 2
Triglycerides: 78 mg/dL (ref 0.0–149.0)
VLDL: 15.6 mg/dL (ref 0.0–40.0)

## 2023-06-04 LAB — COMPREHENSIVE METABOLIC PANEL
ALT: 14 U/L (ref 0–35)
AST: 20 U/L (ref 0–37)
Albumin: 4.5 g/dL (ref 3.5–5.2)
Alkaline Phosphatase: 77 U/L (ref 39–117)
BUN: 19 mg/dL (ref 6–23)
CO2: 28 mEq/L (ref 19–32)
Calcium: 9.3 mg/dL (ref 8.4–10.5)
Chloride: 104 mEq/L (ref 96–112)
Creatinine, Ser: 1 mg/dL (ref 0.40–1.20)
GFR: 71.18 mL/min (ref >60.00)
Glucose, Bld: 74 mg/dL (ref 70–99)
Potassium: 4.1 mEq/L (ref 3.5–5.1)
Sodium: 139 mEq/L (ref 135–145)
Total Bilirubin: 0.5 mg/dL (ref 0.2–1.2)
Total Protein: 7.2 g/dL (ref 6.0–8.3)

## 2023-06-04 LAB — VITAMIN D 25 HYDROXY (VIT D DEFICIENCY, FRACTURES): VITD: 24.29 ng/mL — ABNORMAL LOW (ref 30.00–100.00)

## 2023-06-04 LAB — HEMOGLOBIN A1C: Hgb A1c MFr Bld: 5.4 % (ref 4.6–6.5)

## 2023-06-04 LAB — TSH: TSH: 0.95 u[IU]/mL (ref 0.35–5.50)

## 2023-06-08 DIAGNOSIS — F331 Major depressive disorder, recurrent, moderate: Secondary | ICD-10-CM | POA: Diagnosis not present

## 2023-06-09 ENCOUNTER — Encounter: Payer: Self-pay | Admitting: Family Medicine

## 2023-06-15 DIAGNOSIS — M722 Plantar fascial fibromatosis: Secondary | ICD-10-CM | POA: Diagnosis not present

## 2023-06-16 DIAGNOSIS — F331 Major depressive disorder, recurrent, moderate: Secondary | ICD-10-CM | POA: Diagnosis not present

## 2023-06-18 ENCOUNTER — Encounter: Payer: BC Managed Care – PPO | Admitting: Family Medicine

## 2023-06-24 DIAGNOSIS — F331 Major depressive disorder, recurrent, moderate: Secondary | ICD-10-CM | POA: Diagnosis not present

## 2023-06-30 ENCOUNTER — Ambulatory Visit: Payer: BC Managed Care – PPO | Admitting: Physician Assistant

## 2023-06-30 DIAGNOSIS — M25531 Pain in right wrist: Secondary | ICD-10-CM | POA: Insufficient documentation

## 2023-06-30 DIAGNOSIS — M542 Cervicalgia: Secondary | ICD-10-CM | POA: Diagnosis not present

## 2023-06-30 DIAGNOSIS — M25532 Pain in left wrist: Secondary | ICD-10-CM | POA: Diagnosis not present

## 2023-07-01 DIAGNOSIS — F649 Gender identity disorder, unspecified: Secondary | ICD-10-CM | POA: Diagnosis not present

## 2023-07-04 DIAGNOSIS — M542 Cervicalgia: Secondary | ICD-10-CM | POA: Diagnosis not present

## 2023-07-08 DIAGNOSIS — F331 Major depressive disorder, recurrent, moderate: Secondary | ICD-10-CM | POA: Diagnosis not present

## 2023-07-17 DIAGNOSIS — M542 Cervicalgia: Secondary | ICD-10-CM | POA: Diagnosis not present

## 2023-07-20 DIAGNOSIS — F331 Major depressive disorder, recurrent, moderate: Secondary | ICD-10-CM | POA: Diagnosis not present

## 2023-07-23 DIAGNOSIS — Z888 Allergy status to other drugs, medicaments and biological substances status: Secondary | ICD-10-CM | POA: Diagnosis not present

## 2023-07-23 DIAGNOSIS — F649 Gender identity disorder, unspecified: Secondary | ICD-10-CM | POA: Diagnosis not present

## 2023-07-23 DIAGNOSIS — F640 Transsexualism: Secondary | ICD-10-CM | POA: Diagnosis not present

## 2023-07-29 DIAGNOSIS — F649 Gender identity disorder, unspecified: Secondary | ICD-10-CM | POA: Diagnosis not present

## 2023-08-03 DIAGNOSIS — F331 Major depressive disorder, recurrent, moderate: Secondary | ICD-10-CM | POA: Diagnosis not present

## 2023-08-12 DIAGNOSIS — F649 Gender identity disorder, unspecified: Secondary | ICD-10-CM | POA: Diagnosis not present

## 2023-08-13 ENCOUNTER — Ambulatory Visit: Payer: BC Managed Care – PPO | Admitting: Physician Assistant

## 2023-08-17 DIAGNOSIS — F331 Major depressive disorder, recurrent, moderate: Secondary | ICD-10-CM | POA: Diagnosis not present

## 2023-08-18 DIAGNOSIS — Z681 Body mass index (BMI) 19 or less, adult: Secondary | ICD-10-CM | POA: Diagnosis not present

## 2023-08-18 DIAGNOSIS — Z319 Encounter for procreative management, unspecified: Secondary | ICD-10-CM | POA: Diagnosis not present

## 2023-08-25 ENCOUNTER — Encounter: Payer: Self-pay | Admitting: Physician Assistant

## 2023-08-25 ENCOUNTER — Ambulatory Visit: Payer: BC Managed Care – PPO | Admitting: Physician Assistant

## 2023-08-25 DIAGNOSIS — F3341 Major depressive disorder, recurrent, in partial remission: Secondary | ICD-10-CM

## 2023-08-25 DIAGNOSIS — Z3009 Encounter for other general counseling and advice on contraception: Secondary | ICD-10-CM

## 2023-08-25 DIAGNOSIS — F649 Gender identity disorder, unspecified: Secondary | ICD-10-CM

## 2023-08-25 DIAGNOSIS — F3281 Premenstrual dysphoric disorder: Secondary | ICD-10-CM

## 2023-08-25 MED ORDER — ESCITALOPRAM OXALATE 10 MG PO TABS: ORAL_TABLET | ORAL | Status: DC

## 2023-08-25 NOTE — Progress Notes (Signed)
[facility]  Patient ID: Ann Hall,  MRN: [id number]  PCP: Ann Cables, MD  Date of Evaluation: 08/25/2023 time spent:25 minutes  Chief Complaint:  Chief Complaint   Depression; Follow-up    HISTORY/CURRENT STATUS: HPI For routine med check.  Ann Hall is doing well overall.  See review of systems.  She has questions about mental health medications in pregnancy.  She is doing fine on the Wellbutrin.  We changed Lexapro to Zoloft at the last visit for PMDD.  It has not been helpful although she has only been able to try it 1-2 cycles.  It has given her headache every day.  The Lexapro was effective.  Patient is able to enjoy things.  Energy and motivation are good.  Work is going well, still part-time.    No extreme sadness, tearfulness, or feelings of hopelessness.  Sleeps well most of the time. ADLs and personal hygiene are normal.   Denies any changes in concentration, making decisions, or remembering things.  Appetite has not changed.  Weight is stable.  No anxiety to speak of.  Denies suicidal or homicidal thoughts.  Patient denies increased energy with decreased need for sleep, increased talkativeness, racing thoughts, impulsivity or risky behaviors, increased spending, increased libido, grandiosity, increased irritability or anger, paranoia, or hallucinations.  Denies dizziness, syncope, seizures, numbness, tingling, tremor, tics, unsteady gait, slurred speech, confusion. Denies muscle or joint pain, stiffness, or dystonia.  Individual Medical History/ Review of Systems: Changes? :Yes     had bilat mastectomy in Sept. gender dysphoria. Her wife, Ann Hall, has been trying to get pregnant, naturally for approximately 8 months and then for the past 4 months had IUI.  It does not seem that she will be able to conceive, the plan now is for Ann Hall to carry.  They plan to start trying at home with a known donor next week, have a consultation with fertility specialist in  February if she has not yet conceived.  Past medications for mental health diagnoses include: Lexapro started fall of 2019 weaned off last fall d/t side effects of no sex drive.  Wellbutrin.  Zoloft caused severe headache when used for PMDD.  Allergies: Gabapentin   Current Medications:  Current Outpatient Medications:    buPROPion (WELLBUTRIN XL) 150 MG 24 hr tablet, Take 3 tablets (450 mg total) by mouth daily., Disp: 270 tablet, Rfl: 3   cholecalciferol (VITAMIN D3) 25 MCG (1000 UNIT) tablet, Take 1,000 Units by mouth daily., Disp: , Rfl:    folic acid (FOLVITE) 400 MCG tablet, Take 400 mcg by mouth daily., Disp: , Rfl:    EPINEPHrine 0.3 mg/0.3 mL IJ SOAJ injection, Inject 0.3 mg into the muscle as needed for anaphylaxis. (Patient not taking: Reported on 08/25/2023), Disp: 1 each, Rfl: prn   escitalopram (LEXAPRO) 10 MG tablet, One half p.o. daily beginning 7 days before menses and then stop once menses begins., Disp: , Rfl:  Medication Side Effects: sexual dysfunction  Family Medical/ Social History: Changes? No  MENTAL HEALTH EXAM:  There were no vitals taken for this visit.There is no height or weight on file to calculate BMI.  General Appearance: Casual and Well Groomed  Eye Contact:  Good  Speech:  Clear and Coherent and Normal Rate  Volume:  Normal  Mood:  Euthymic  Affect:  Congruent  Thought Process:  Goal Directed and Descriptions of Associations: Circumstantial  Orientation:  Full (Time, Place, and Person)  Thought Content: Logical   Suicidal Thoughts:  No  Homicidal Thoughts:  No  Memory:  WNL  Judgement:  Good  Insight:  Good  Psychomotor Activity:  Normal  Concentration:  Concentration: Good and Attention Span: Good  Recall:  Good  Fund of Knowledge: Good  Language: Good  Assets:  Communication Skills Desire for Improvement Financial Resources/Insurance Housing Resilience Social Support Transportation  ADL's:  Intact  Cognition: WNL  Prognosis:  Good    DIAGNOSES:    ICD-10-CM   1. Depression, major, recurrent, in partial remission (HCC)  F33.41     2. PMDD (premenstrual dysphoric disorder)  F32.81     3. Gender dysphoria  F64.9     4. Family planning advice  Z30.09       Receiving Psychotherapy: Yes  continue counseling with Ann Hall  RECOMMENDATIONS:  PDMP reviewed.  Oxycodone filled 07/23/2023.  Modafinil filled 07/29/2022. (She never took) I provided 25 minutes of face to face time during this encounter, including time spent before and after the visit in records review, medical decision making, counseling pertinent to today's visit, and charting.   I provided approximately 17 minutes of family planning counseling.  Current medications are considered as safe as possible in a baby in utero.  No evidence of increase in cleft lip or palate.  Of course her age is also a factor but she we will discuss with her OB.  Also we would monitor her mood very closely and also watch closely for postpartum depression.  Since the Zoloft has not helped with the PMDD we will change back to Lexapro.  Continue Wellbutrin XL 150 mg, 3 qd. Restart Lexapro 10 mg, one half daily for 7 days prior to menses and then stop it once menses begins. Continue therapy with Ann Hall. Return in 2 to 3 months.  Ann Overly, PA-C

## 2023-09-02 ENCOUNTER — Telehealth: Payer: Self-pay | Admitting: Family Medicine

## 2023-09-02 NOTE — Telephone Encounter (Signed)
Labs have been faxed.

## 2023-09-02 NOTE — Telephone Encounter (Signed)
Pt called and requested for pcp to send her lab results from 7/25 to [facility]. She provided their fax number that is [phone]. Please advise pt.

## 2023-09-03 DIAGNOSIS — Z3149 Encounter for other procreative investigation and testing: Secondary | ICD-10-CM | POA: Diagnosis not present

## 2023-09-03 DIAGNOSIS — Z319 Encounter for procreative management, unspecified: Secondary | ICD-10-CM | POA: Diagnosis not present

## 2023-09-10 DIAGNOSIS — Z319 Encounter for procreative management, unspecified: Secondary | ICD-10-CM | POA: Diagnosis not present

## 2023-09-16 DIAGNOSIS — F331 Major depressive disorder, recurrent, moderate: Secondary | ICD-10-CM | POA: Diagnosis not present

## 2023-09-22 ENCOUNTER — Ambulatory Visit: Payer: BC Managed Care – PPO | Admitting: Physician Assistant

## 2023-09-23 DIAGNOSIS — F331 Major depressive disorder, recurrent, moderate: Secondary | ICD-10-CM | POA: Diagnosis not present

## 2023-09-29 DIAGNOSIS — F331 Major depressive disorder, recurrent, moderate: Secondary | ICD-10-CM | POA: Diagnosis not present

## 2023-10-12 DIAGNOSIS — K1370 Unspecified lesions of oral mucosa: Secondary | ICD-10-CM | POA: Diagnosis not present

## 2023-10-12 DIAGNOSIS — K116 Mucocele of salivary gland: Secondary | ICD-10-CM | POA: Diagnosis not present

## 2023-10-12 DIAGNOSIS — K1120 Sialoadenitis, unspecified: Secondary | ICD-10-CM | POA: Diagnosis not present

## 2023-10-14 DIAGNOSIS — F649 Gender identity disorder, unspecified: Secondary | ICD-10-CM | POA: Diagnosis not present

## 2023-10-20 DIAGNOSIS — K116 Mucocele of salivary gland: Secondary | ICD-10-CM | POA: Diagnosis not present

## 2023-10-20 DIAGNOSIS — K1120 Sialoadenitis, unspecified: Secondary | ICD-10-CM | POA: Diagnosis not present

## 2023-10-27 DIAGNOSIS — F331 Major depressive disorder, recurrent, moderate: Secondary | ICD-10-CM | POA: Diagnosis not present

## 2023-11-10 DIAGNOSIS — F331 Major depressive disorder, recurrent, moderate: Secondary | ICD-10-CM | POA: Diagnosis not present

## 2023-11-27 DIAGNOSIS — F331 Major depressive disorder, recurrent, moderate: Secondary | ICD-10-CM | POA: Diagnosis not present

## 2023-12-01 ENCOUNTER — Ambulatory Visit: Payer: BC Managed Care – PPO | Admitting: Physician Assistant

## 2023-12-11 DIAGNOSIS — F331 Major depressive disorder, recurrent, moderate: Secondary | ICD-10-CM | POA: Diagnosis not present

## 2023-12-18 DIAGNOSIS — Z789 Other specified health status: Secondary | ICD-10-CM | POA: Diagnosis not present

## 2023-12-18 DIAGNOSIS — Z3169 Encounter for other general counseling and advice on procreation: Secondary | ICD-10-CM | POA: Diagnosis not present

## 2023-12-25 ENCOUNTER — Encounter: Payer: Self-pay | Admitting: Physician Assistant

## 2023-12-25 ENCOUNTER — Ambulatory Visit (INDEPENDENT_AMBULATORY_CARE_PROVIDER_SITE_OTHER): Payer: BC Managed Care – PPO | Admitting: Physician Assistant

## 2023-12-25 DIAGNOSIS — F649 Gender identity disorder, unspecified: Secondary | ICD-10-CM | POA: Diagnosis not present

## 2023-12-25 DIAGNOSIS — F3341 Major depressive disorder, recurrent, in partial remission: Secondary | ICD-10-CM

## 2023-12-25 DIAGNOSIS — F3281 Premenstrual dysphoric disorder: Secondary | ICD-10-CM

## 2023-12-25 NOTE — Progress Notes (Signed)
 [facility]  Patient ID: Ann Hall,  MRN: [id number]  PCP: Ann Cables, MD  Date of Evaluation: 12/25/2023 time spent:20 minutes  Chief Complaint:  Chief Complaint   Depression; Follow-up    HISTORY/CURRENT STATUS: HPI For routine med check.  Got really depressed in Nov, lasted into Jan but feels better now. Back to her baseline. Had several triggers in Dec, got denial for disability. That was depressing b/c she's unable to work a fulltime job, still works part-time in a gym, unable to work longer d/t physical and mental health. Has been sad b/c she and wife are having trouble conceiving. She's seeing fertility specialist. Still has some sadness but no feelings of hopelessness.  Sleeps well most of the time. ADLs and personal hygiene are normal.   Denies any changes in concentration, making decisions, or remembering things.  Appetite has not changed.  Weight is stable. No PA but she does get overwhelmed easily.  PMDD is much better. Denies suicidal or homicidal thoughts.  Patient denies increased energy with decreased need for sleep, increased talkativeness, racing thoughts, impulsivity or risky behaviors, increased spending, increased libido, grandiosity, increased irritability or anger, paranoia, or hallucinations.  Denies dizziness, syncope, seizures, numbness, tingling, tremor, tics, unsteady gait, slurred speech, confusion. Denies muscle or joint pain, stiffness, or dystonia.  Individual Medical History/ Review of Systems: Changes? :Yes     has had 2 IUIs since LOV, unsuccessful.  Past medications for mental health diagnoses include: Ann Hall started fall of 2019 weaned off last fall d/t side effects of no sex drive.  Ann Hall.  Zoloft caused severe headache when used for PMDD.  Allergies: Gabapentin   Current Medications:  Current Outpatient Medications:    buPROPion (Ann Hall XL) 150 MG 24 hr tablet, Take 3 tablets (450 mg total) by mouth daily., Disp:  270 tablet, Rfl: 3   cholecalciferol (VITAMIN D3) 25 MCG (1000 UNIT) tablet, Take 1,000 Units by mouth daily., Disp: , Rfl:    escitalopram (Ann Hall) 10 MG tablet, One half p.o. daily beginning 7 days before menses and then stop once menses begins., Disp: , Rfl:    folic acid (FOLVITE) 400 MCG tablet, Take 400 mcg by mouth daily., Disp: , Rfl:    EPINEPHrine 0.3 mg/0.3 mL IJ SOAJ injection, Inject 0.3 mg into the muscle as needed for anaphylaxis. (Patient not taking: Reported on 12/25/2023), Disp: 1 each, Rfl: prn Medication Side Effects: sexual dysfunction  Family Medical/ Social History: Changes? Has applied to firefighter program.   MENTAL HEALTH EXAM:  There were no vitals taken for this visit.There is no height or weight on file to calculate BMI.  General Appearance: Casual and Well Groomed  Eye Contact:  Good  Speech:  Clear and Coherent and Normal Rate  Volume:  Normal  Mood:   sad  Affect:  Congruent  Thought Process:  Goal Directed and Descriptions of Associations: Circumstantial  Orientation:  Full (Time, Place, and Person)  Thought Content: Logical   Suicidal Thoughts:  No  Homicidal Thoughts:  No  Memory:  WNL  Judgement:  Good  Insight:  Good  Psychomotor Activity:  Normal  Concentration:  Concentration: Good and Attention Span: Good  Recall:  Good  Fund of Knowledge: Good  Language: Good  Assets:  Communication Skills Desire for Improvement Financial Resources/Insurance Housing Resilience Social Support Transportation  ADL's:  Intact  Cognition: WNL  Prognosis:  Good   DIAGNOSES:    ICD-10-CM   1. Depression, major, recurrent, in partial remission (HCC)  F33.41     2. Gender dysphoria  F64.9     3. PMDD (premenstrual dysphoric disorder)  F32.81       Receiving Psychotherapy: Yes  continue counseling with Ann Hall  RECOMMENDATIONS:  PDMP reviewed.  Tylenol 3 given 10/21/2023. I provided  minutes of face to face time during this encounter,  including time spent before and after the visit in records review, medical decision making, counseling pertinent to today's visit, and charting.   Briefly discussed Auvelity.  If she wants to try it, she can call. Of course we will have to wait and when she conceives, or before another attempt, I'll discuss what is ok for her to take during pregnancy.  For now, no changes though.   Continue Ann Hall XL 150 mg, 3 qd. Continue  Ann Hall 10 mg, one half daily for 7 days prior to menses and then stop it once menses begins. Continue therapy with Ann Hall. Return in 3 months.  Ann Overly, PA-C

## 2023-12-29 DIAGNOSIS — F331 Major depressive disorder, recurrent, moderate: Secondary | ICD-10-CM | POA: Diagnosis not present

## 2024-01-12 DIAGNOSIS — F649 Gender identity disorder, unspecified: Secondary | ICD-10-CM | POA: Diagnosis not present

## 2024-01-19 DIAGNOSIS — F331 Major depressive disorder, recurrent, moderate: Secondary | ICD-10-CM | POA: Diagnosis not present

## 2024-02-02 DIAGNOSIS — F331 Major depressive disorder, recurrent, moderate: Secondary | ICD-10-CM | POA: Diagnosis not present

## 2024-02-12 ENCOUNTER — Telehealth: Payer: Self-pay | Admitting: Physician Assistant

## 2024-02-12 DIAGNOSIS — F331 Major depressive disorder, recurrent, moderate: Secondary | ICD-10-CM | POA: Diagnosis not present

## 2024-02-12 MED ORDER — ESCITALOPRAM OXALATE 10 MG PO TABS: ORAL_TABLET | ORAL | 0 refills | Status: DC

## 2024-02-12 NOTE — Telephone Encounter (Signed)
 Pt called 6:44 am requesting RF Lexapro 10 mg tab at [facility]. Apt  4/24

## 2024-02-12 NOTE — Telephone Encounter (Signed)
 Rx sent to [facility] for Lexapro

## 2024-02-18 DIAGNOSIS — Z3169 Encounter for other general counseling and advice on procreation: Secondary | ICD-10-CM | POA: Diagnosis not present

## 2024-02-25 DIAGNOSIS — Z1159 Encounter for screening for other viral diseases: Secondary | ICD-10-CM | POA: Diagnosis not present

## 2024-02-25 DIAGNOSIS — Z131 Encounter for screening for diabetes mellitus: Secondary | ICD-10-CM | POA: Diagnosis not present

## 2024-02-25 DIAGNOSIS — Z0000 Encounter for general adult medical examination without abnormal findings: Secondary | ICD-10-CM | POA: Diagnosis not present

## 2024-02-25 DIAGNOSIS — Z114 Encounter for screening for human immunodeficiency virus [HIV]: Secondary | ICD-10-CM | POA: Diagnosis not present

## 2024-02-25 DIAGNOSIS — Z118 Encounter for screening for other infectious and parasitic diseases: Secondary | ICD-10-CM | POA: Diagnosis not present

## 2024-02-25 DIAGNOSIS — E282 Polycystic ovarian syndrome: Secondary | ICD-10-CM | POA: Diagnosis not present

## 2024-02-25 DIAGNOSIS — F3281 Premenstrual dysphoric disorder: Secondary | ICD-10-CM | POA: Diagnosis not present

## 2024-02-29 DIAGNOSIS — F332 Major depressive disorder, recurrent severe without psychotic features: Secondary | ICD-10-CM | POA: Diagnosis not present

## 2024-03-01 DIAGNOSIS — F331 Major depressive disorder, recurrent, moderate: Secondary | ICD-10-CM | POA: Diagnosis not present

## 2024-03-03 ENCOUNTER — Ambulatory Visit: Payer: BC Managed Care – PPO | Admitting: Physician Assistant

## 2024-03-03 ENCOUNTER — Encounter: Payer: Self-pay | Admitting: Physician Assistant

## 2024-03-03 DIAGNOSIS — F649 Gender identity disorder, unspecified: Secondary | ICD-10-CM

## 2024-03-03 DIAGNOSIS — F3281 Premenstrual dysphoric disorder: Secondary | ICD-10-CM

## 2024-03-03 DIAGNOSIS — F3341 Major depressive disorder, recurrent, in partial remission: Secondary | ICD-10-CM | POA: Diagnosis not present

## 2024-03-03 MED ORDER — ESCITALOPRAM OXALATE 10 MG PO TABS: ORAL_TABLET | ORAL | 0 refills | Status: AC

## 2024-03-03 NOTE — Progress Notes (Signed)
 [facility]  Patient ID: Ann Hall,  MRN: [id number]  PCP: Kaylee Partridge, MD  Date of Evaluation: 03/03/2024 time spent:25 minutes  Chief Complaint:  Chief Complaint   Anxiety; Depression; Follow-up    HISTORY/CURRENT STATUS: HPI For routine med check.  She is still having problems with PMDD.  Her menses are irregular, recently was 35+ days.  She saw different GYN recently who recommended taking progesterone  daily, after the infertility treatment is complete.  At this point she is waiting on a lot of labs.  She will have eggs harvested, they will be fertilized with a known donor and then implanted into her wife.  So they have a lot of hoops to go through before this could even be considered.  The GYN also recommended increasing the Lexapro  to 20 mg those 5 to 7 days before her cycle.  Gem states she is doing pretty well as far as depression and anxiety go.  She is working part-time still as a Systems analyst.  There is a lot of stress related to the fertility treatments.  She will start seeing a counselor there that specifically deals with fertility issues.  She sleeps well.  ADLs and personal hygiene are normal.  Appetite is normal.  Weight is stable.  No panic attacks.  No suicidal or homicidal thoughts.  Patient denies increased energy with decreased need for sleep, increased talkativeness, racing thoughts, impulsivity or risky behaviors, increased spending, increased libido, grandiosity, increased irritability or anger, paranoia, or hallucinations.  Denies dizziness, syncope, seizures, numbness, tingling, tremor, tics, unsteady gait, slurred speech, confusion. Denies muscle or joint pain, stiffness, or dystonia.  Individual Medical History/ Review of Systems: Changes? :Yes    PCOS, her wife is now planning to be the baby's carrier.  Past medications for mental health diagnoses include: Lexapro  started fall of 2019 weaned off last fall d/t side effects of no  sex drive.  Wellbutrin .  Zoloft  caused severe headache when used for PMDD.  Allergies: Gabapentin    Current Medications:  Current Outpatient Medications:    buPROPion  (WELLBUTRIN  XL) 150 MG 24 hr tablet, Take 3 tablets (450 mg total) by mouth daily., Disp: 270 tablet, Rfl: 3   cholecalciferol (VITAMIN D3) 25 MCG (1000 UNIT) tablet, Take 1,000 Units by mouth daily., Disp: , Rfl:    folic acid (FOLVITE) 400 MCG tablet, Take 400 mcg by mouth daily., Disp: , Rfl:    EPINEPHrine  0.3 mg/0.3 mL IJ SOAJ injection, Inject 0.3 mg into the muscle as needed for anaphylaxis. (Patient not taking: Reported on 08/25/2023), Disp: 1 each, Rfl: prn   escitalopram  (LEXAPRO ) 10 MG tablet, 1 po every day for 7 days prior to menses, once period begins, stop the Lexapro , Disp: 90 tablet, Rfl: 0 Medication Side Effects: sexual dysfunction  Family Medical/ Social History: Changes? Has applied to firefighter program.   MENTAL HEALTH EXAM:  There were no vitals taken for this visit.There is no height or weight on file to calculate BMI.  General Appearance: Casual and Well Groomed  Eye Contact:  Good  Speech:  Clear and Coherent and Normal Rate  Volume:  Normal  Mood:  Euthymic  Affect:  Congruent  Thought Process:  Goal Directed and Descriptions of Associations: Circumstantial  Orientation:  Full (Time, Place, and Person)  Thought Content: Logical   Suicidal Thoughts:  No  Homicidal Thoughts:  No  Memory:  WNL  Judgement:  Good  Insight:  Good  Psychomotor Activity:  Normal  Concentration:  Concentration: Good  and Attention Span: Good  Recall:  Good  Fund of Knowledge: Good  Language: Good  Assets:  Communication Skills Desire for Improvement Financial Resources/Insurance Housing Resilience Social Support Transportation  ADL's:  Intact  Cognition: WNL  Prognosis:  Good   DIAGNOSES:    ICD-10-CM   1. PMDD (premenstrual dysphoric disorder)  F32.81     2. Gender dysphoria  F64.9     3.  Depression, major, recurrent, in partial remission (HCC)  F33.41      Receiving Psychotherapy: Yes  continue counseling with Nelia Balzarine  RECOMMENDATIONS:  PDMP reviewed.  Tylenol  3 given 10/21/2023. I provided 25 minutes of face to face time during this encounter, including time spent before and after the visit in records review, medical decision making, counseling pertinent to today's visit, and charting.   We have discussed changing Wellbutrin  to [employer].  At this point I recommend following her GYN's advice, taking the progesterone  only, after fertility treatment is complete.  I will increase Lexapro  to 10 mg for the 5 to 7 days before her menstrual cycle.  We also discussed her gaining a few pounds.  She is tall and thin and sometimes not having enough fat can affect the menstrual cycle.  If it is more regular at least she would know when to take the Lexapro .  Or if the GYN prefers for her to stay on continuous progesterone  then she would not have to worry about PMDD.  She is still planning to take the fitness test to get into firefighter school.  That has been a dream of hers for a while.  In the past she has not been able to work full-time due to severe depression but really wants to be a IT sales professional.  Continue Wellbutrin  XL 150 mg, 3 qd. Increase Lexapro  10 mg, to 1 p.o. daily 7 days prior to menses and then stop it once menses begins. Continue therapy with Jessie Spence. Return in 3-4 months.  Marvia Slocumb, PA-C

## 2024-03-07 DIAGNOSIS — F332 Major depressive disorder, recurrent severe without psychotic features: Secondary | ICD-10-CM | POA: Diagnosis not present

## 2024-03-09 DIAGNOSIS — Z319 Encounter for procreative management, unspecified: Secondary | ICD-10-CM | POA: Diagnosis not present

## 2024-03-14 DIAGNOSIS — F331 Major depressive disorder, recurrent, moderate: Secondary | ICD-10-CM | POA: Diagnosis not present

## 2024-03-17 DIAGNOSIS — F332 Major depressive disorder, recurrent severe without psychotic features: Secondary | ICD-10-CM | POA: Diagnosis not present

## 2024-03-21 DIAGNOSIS — F332 Major depressive disorder, recurrent severe without psychotic features: Secondary | ICD-10-CM | POA: Diagnosis not present

## 2024-03-25 DIAGNOSIS — Z3169 Encounter for other general counseling and advice on procreation: Secondary | ICD-10-CM | POA: Diagnosis not present

## 2024-03-28 DIAGNOSIS — F332 Major depressive disorder, recurrent severe without psychotic features: Secondary | ICD-10-CM | POA: Diagnosis not present

## 2024-03-29 DIAGNOSIS — F331 Major depressive disorder, recurrent, moderate: Secondary | ICD-10-CM | POA: Diagnosis not present

## 2024-04-11 ENCOUNTER — Ambulatory Visit
Admission: RE | Admit: 2024-04-11 | Discharge: 2024-04-11 | Disposition: A | Discharge status: Home / Self Care | Source: Ambulatory Visit | Attending: Nurse Practitioner | Admitting: Nurse Practitioner

## 2024-04-11 ENCOUNTER — Other Ambulatory Visit: Payer: Self-pay | Admitting: Nurse Practitioner

## 2024-04-11 DIAGNOSIS — Z021 Encounter for pre-employment examination: Secondary | ICD-10-CM

## 2024-04-11 DIAGNOSIS — F332 Major depressive disorder, recurrent severe without psychotic features: Secondary | ICD-10-CM | POA: Diagnosis not present

## 2024-05-03 DIAGNOSIS — Z3141 Encounter for fertility testing: Secondary | ICD-10-CM | POA: Diagnosis not present

## 2024-05-05 DIAGNOSIS — F332 Major depressive disorder, recurrent severe without psychotic features: Secondary | ICD-10-CM | POA: Diagnosis not present

## 2024-05-09 ENCOUNTER — Ambulatory Visit (HOSPITAL_BASED_OUTPATIENT_CLINIC_OR_DEPARTMENT_OTHER): Admitting: Pulmonary Disease

## 2024-05-10 DIAGNOSIS — F331 Major depressive disorder, recurrent, moderate: Secondary | ICD-10-CM | POA: Diagnosis not present

## 2024-05-11 DIAGNOSIS — Z3141 Encounter for fertility testing: Secondary | ICD-10-CM | POA: Diagnosis not present

## 2024-05-14 DIAGNOSIS — Z3141 Encounter for fertility testing: Secondary | ICD-10-CM | POA: Diagnosis not present

## 2024-05-16 DIAGNOSIS — Z3141 Encounter for fertility testing: Secondary | ICD-10-CM | POA: Diagnosis not present

## 2024-05-17 DIAGNOSIS — Z3141 Encounter for fertility testing: Secondary | ICD-10-CM | POA: Diagnosis not present

## 2024-05-18 DIAGNOSIS — Z3141 Encounter for fertility testing: Secondary | ICD-10-CM | POA: Diagnosis not present

## 2024-05-24 DIAGNOSIS — F331 Major depressive disorder, recurrent, moderate: Secondary | ICD-10-CM | POA: Diagnosis not present

## 2024-05-27 ENCOUNTER — Other Ambulatory Visit: Payer: Self-pay | Admitting: Physician Assistant

## 2024-05-30 ENCOUNTER — Ambulatory Visit: Payer: BC Managed Care – PPO | Admitting: Dermatology

## 2024-05-31 ENCOUNTER — Ambulatory Visit: Payer: BC Managed Care – PPO | Admitting: Dermatology

## 2024-05-31 ENCOUNTER — Encounter: Payer: Self-pay | Admitting: Dermatology

## 2024-05-31 VITALS — BP 117/76 | HR 74

## 2024-05-31 DIAGNOSIS — D485 Neoplasm of uncertain behavior of skin: Secondary | ICD-10-CM

## 2024-05-31 DIAGNOSIS — Z1283 Encounter for screening for malignant neoplasm of skin: Secondary | ICD-10-CM | POA: Diagnosis not present

## 2024-05-31 DIAGNOSIS — L821 Other seborrheic keratosis: Secondary | ICD-10-CM

## 2024-05-31 DIAGNOSIS — D239 Other benign neoplasm of skin, unspecified: Secondary | ICD-10-CM | POA: Diagnosis not present

## 2024-05-31 DIAGNOSIS — D492 Neoplasm of unspecified behavior of bone, soft tissue, and skin: Secondary | ICD-10-CM

## 2024-05-31 DIAGNOSIS — D229 Melanocytic nevi, unspecified: Secondary | ICD-10-CM

## 2024-05-31 DIAGNOSIS — L578 Other skin changes due to chronic exposure to nonionizing radiation: Secondary | ICD-10-CM

## 2024-05-31 DIAGNOSIS — M47819 Spondylosis without myelopathy or radiculopathy, site unspecified: Secondary | ICD-10-CM | POA: Insufficient documentation

## 2024-05-31 DIAGNOSIS — L239 Allergic contact dermatitis, unspecified cause: Secondary | ICD-10-CM

## 2024-05-31 DIAGNOSIS — D1801 Hemangioma of skin and subcutaneous tissue: Secondary | ICD-10-CM | POA: Diagnosis not present

## 2024-05-31 DIAGNOSIS — L814 Other melanin hyperpigmentation: Secondary | ICD-10-CM

## 2024-05-31 DIAGNOSIS — D225 Melanocytic nevi of trunk: Secondary | ICD-10-CM | POA: Diagnosis not present

## 2024-05-31 DIAGNOSIS — L720 Epidermal cyst: Secondary | ICD-10-CM

## 2024-05-31 DIAGNOSIS — W908XXA Exposure to other nonionizing radiation, initial encounter: Secondary | ICD-10-CM

## 2024-05-31 MED ORDER — CLOBETASOL PROPIONATE 0.05 % EX OINT: 1.0000 | TOPICAL_OINTMENT | Freq: Two times a day (BID) | CUTANEOUS | 4 refills | Status: AC

## 2024-05-31 NOTE — Progress Notes (Signed)
 New Patient Visit   Subjective  Ann Hall is a 38 y.o. adult who presents for the following:  Total Body Skin Exam (TBSE)  Patient present today for new patient visit for TBSE.The patient reports she has spots, moles and lesions to be evaluated, some may be new or changing and the patient may have concern these could be cancer. Patient has not previously been treated by a dermatologist.Patient reports she does not have hx of bx. Patient reports family history of skin cancers. Patient reports throughout her lifetime has had severe sun exposure. Currently, patient reports if she has excessive sun exposure, she does apply sunscreen and/or wears protective coverings.  The following portions of the chart were reviewed this encounter and updated as appropriate: medications, allergies, medical history  Review of Systems:  No other skin or systemic complaints except as noted in HPI or Assessment and Plan.  Objective  Well appearing patient in no apparent distress; mood and affect are within normal limits.  A full examination was performed including scalp, head, eyes, ears, nose, lips, neck, chest, axillae, abdomen, back, buttocks, bilateral upper extremities, bilateral lower extremities, hands, feet, fingers, toes, fingernails, and toenails. All findings within normal limits unless otherwise noted below.   Relevant exam findings are noted in the Assessment and Plan.        Right Abdomen (side) - Upper 6 mm pink papule with halo  Assessment & Plan   LENTIGINES, SEBORRHEIC KERATOSES, HEMANGIOMAS - Benign normal skin lesions - Benign-appearing - Call for any changes  Halo Nevi, Intradermal nevi & MELANOCYTIC NEVI - Tan-brown and/or pink-flesh-colored symmetric macules and papules - Benign appearing on exam today - Observation - Call clinic for new or changing moles - Recommend daily use of broad spectrum spf 30+ sunscreen to sun-exposed areas.   ACTINIC DAMAGE - Chronic condition,  secondary to cumulative UV/sun exposure - diffuse scaly erythematous macules with underlying dyspigmentation - Recommend daily broad spectrum sunscreen SPF 30+ to sun-exposed areas, reapply every 2 hours as needed.  - Staying in the shade or wearing long sleeves, sun glasses (UVA+UVB protection) and wide brim hats (4-inch brim around the entire circumference of the hat) are also recommended for sun protection.  - Call for new or changing lesions.  EPIDERMAL INCLUSION CYST Exam: Subcutaneous nodule at Posterior Neck  Benign-appearing. Exam most consistent with an epidermal inclusion cyst. Discussed that a cyst is a benign growth that can grow over time and sometimes get irritated or inflamed. Recommend observation if it is not bothersome. Discussed option of surgical excision to remove it if it is growing, symptomatic, or other changes noted. Please call for new or changing lesions so they can be evaluated.  SKIN CANCER SCREENING PERFORMED TODAY NEOPLASM OF UNCERTAIN BEHAVIOR OF SKIN Right Abdomen (side) - Upper Epidermal / dermal shaving  Lesion diameter (cm):  0.6 Informed consent: discussed and consent obtained   Timeout: patient name, date of birth, surgical site, and procedure verified   Procedure prep:  Patient was prepped and draped in usual sterile fashion Prep type:  Isopropyl alcohol Anesthesia: the lesion was anesthetized in a standard fashion   Anesthetic:  1% lidocaine w/ epinephrine  1-100,000 buffered w/ 8.4% NaHCO3 Instrument used: DermaBlade   Hemostasis achieved with: pressure, aluminum chloride and electrodesiccation   Outcome: patient tolerated procedure well   Post-procedure details: sterile dressing applied and wound care instructions given   Dressing type: bandage and petrolatum    Specimen 1 - Surgical pathology Differential Diagnosis: R/O DN  vs Other  Check Margins: No  Return in about 1 year (around 05/31/2025) for TBSE.  I, Mazelle Huebert, am acting as Neurosurgeon  for [employer], DO.   Documentation: I have reviewed the above documentation for accuracy and completeness, and I agree with the above.  Delon Lenis, DO

## 2024-05-31 NOTE — Patient Instructions (Addendum)

## 2024-06-01 LAB — SURGICAL PATHOLOGY

## 2024-06-02 ENCOUNTER — Ambulatory Visit: Payer: Self-pay | Admitting: Dermatology

## 2024-06-02 ENCOUNTER — Ambulatory Visit: Admitting: Physician Assistant

## 2024-06-07 DIAGNOSIS — M545 Low back pain, unspecified: Secondary | ICD-10-CM | POA: Diagnosis not present

## 2024-06-08 NOTE — Progress Notes (Signed)
 [facility] [address], [address] [place], [place] [address] [phone]  Date:  06/13/2024   Name:  Ann Hall   DOB:  31-Aug-1986   MRN:  [id number]  PCP:  Watt Harlene BROCKS, MD    Chief Complaint: Annual Exam   History of Present Illness:  Ann Hall is a 38 y.o. very pleasant adult patient who presents with the following:  Patient seen today for physical exam Most recent visit with myself about 1 year ago  Generally in good physical health, enjoys getting plenty of exercise No tobacco, rare alcohol  Ann Hall has been following with the [facility] regarding fertility- had an egg retrieval procedure last month.  Per notes it looks like Ann Hall and their spouse are planning to become pregnant in the near future They are also status post bilateral mastectomy, gender affirming care- they note minimal pain and are thrilled with their results  Pap completed 8/23, negative  Ann Hall follows with behavioral health care for depression/PMDD-however they wonder if I could take over her Wellbutrin .  They have been stable on this dose for some time  Can update routine lab work today ?family history of colon cancer:  no  Tetanus booster- done at [facility] in July   Taking total of 450 wellbutrin  daily Will take lexapro  sometimes but not always   Ann Hall notes their premenstrual symptoms can be sometimes severe, menstrual cycle can be irregular.  They are using a progesterone  only OCP; I advised a combination pill may be more effective.  However as they recently underwent an egg retrieval procedure I am not sure if there is some reason they cannot use estrogen right now.  They will reach out to team at [facility] and find out  Wt Readings from Last 3 Encounters:  06/13/24 136 lb (61.7 kg)  06/04/23 135 lb 9.6 oz (61.5 kg)  06/16/22 139 lb (63 kg)       Patient Active Problem List   Diagnosis Date Noted   Spondyloarthropathy  05/31/2024   Bilateral wrist pain 06/30/2023   Neck pain 06/30/2023   Arthralgia of left ankle 11/18/2021   Arthralgia of right ankle 11/18/2021   Tarsal tunnel syndrome of both lower extremities 11/18/2021   Left shoulder pain 05/07/2021   Severe episode of recurrent major depressive disorder, without psychotic features (HCC) 03/30/2021   PMDD (premenstrual dysphoric disorder) 03/30/2021   Pain in joint, ankle and foot 08/19/2019   Piriformis muscle pain 09/02/2018   Neck muscle strain, initial encounter 09/02/2018   Pes anserine bursitis 07/22/2018   Tarsal tunnel syndrome of left side 06/10/2018   Pronation deformity of both feet 06/10/2018   Neuritis of ankle, left 12/11/2017   Plantar fasciitis of left foot 12/11/2017   Hip abductor tendinitis . wekaness, right 07/15/2017   Plantar fasciitis of right foot 07/15/2017    Past Medical History:  Diagnosis Date   Abnormal Pap smear of cervix 2008   Depression    Dysmenorrhea    PTSD (post-traumatic stress disorder)     Past Surgical History:  Procedure Laterality Date   COLPOSCOPY  2008   ? unsure of results   WISDOM TOOTH EXTRACTION Bilateral 2007    Social History   Tobacco Use   Smoking status: Never    Passive exposure: Never   Smokeless tobacco: Never  Substance Use Topics   Alcohol use: Not Currently    Alcohol/week: 0.0 - 1.0 standard drinks of  alcohol   Drug use: No    Family History  Problem Relation Age of Onset   Thyroid  disease Mother    Thyroid  disease Sister    Breast cancer Maternal Grandmother 50   Stroke Maternal Grandmother    Pernicious anemia Maternal Grandmother    Pancreatic cancer Maternal Grandfather    Lymphoma Maternal Grandfather    Heart attack Maternal Grandfather    Heart attack Paternal Grandfather    Healthy Father     Allergies  Allergen Reactions   Gabapentin  Other (See Comments)    suicidal    Medication list has been reviewed and updated.  Current Outpatient  Medications on File Prior to Visit  Medication Sig Dispense Refill   acetaminophen  (TYLENOL ) 500 MG tablet Take 1,000 mg by mouth.     buPROPion  (WELLBUTRIN  XL) 150 MG 24 hr tablet TAKE 3 TABLETS BY MOUTH ONCE A DAY 90 tablet 0   cholecalciferol (VITAMIN D3) 25 MCG (1000 UNIT) tablet Take 1,000 Units by mouth daily.     clobetasol  ointment (TEMOVATE ) 0.05 % Apply 1 Application topically 2 (two) times daily. Apply for 2 weeks then stop 60 g 4   EPINEPHrine  0.3 mg/0.3 mL IJ SOAJ injection Inject 0.3 mg into the muscle as needed for anaphylaxis. 1 each prn   escitalopram  (LEXAPRO ) 10 MG tablet 1 po every day for 7 days prior to menses, once period begins, stop the Lexapro  90 tablet 0   folic acid (FOLVITE) 400 MCG tablet Take 400 mcg by mouth daily. (Patient not taking: Reported on 05/31/2024)     norethindrone (MICRONOR) 0.35 MG tablet Take 0.35 mg by mouth.     No current facility-administered medications on file prior to visit.    Review of Systems:  As per HPI- otherwise negative.   Physical Examination: Vitals:   06/13/24 1018  BP: 112/68  Pulse: 88  SpO2: 98%   Vitals:   06/13/24 1018  Weight: 136 lb (61.7 kg)  Height: 5' 10 (1.778 m)   Body mass index is 19.51 kg/m. Ideal Body Weight: Weight in (lb) to have BMI = 25: 173.9  GEN: no acute distress. Trim build, looks well  HEENT: Atraumatic, Normocephalic.  Bilateral TM wnl, oropharynx normal.  PEERL,EOMI.   Ears and Nose: No external deformity. CV: RRR, No M/G/R. No JVD. No thrill. No extra heart sounds. PULM: CTA B, no wheezes, crackles, rhonchi. No retractions. No resp. distress. No accessory muscle use. ABD: S, NT, ND, +BS. No rebound. No HSM. EXTR: No c/c/e PSYCH: Normally interactive. Conversant.  Status post bilateral mastectomy, examined chest-no abnormalities  Assessment and Plan: Physical exam  Screening for hyperlipidemia - Plan: Lipid panel  Screening for thyroid  disorder - Plan: TSH  Screening for  diabetes mellitus - Plan: Comprehensive metabolic panel with GFR, Hemoglobin A1c  Screening, deficiency anemia, iron - Plan: CBC  Vitamin D  deficiency - Plan: Vitamin D  (25 hydroxy)  PMDD (premenstrual dysphoric disorder) - Plan: buPROPion  (WELLBUTRIN  XL) 150 MG 24 hr tablet  Physical exam today.  Encouraged healthy diet and exercise routine Drinda is doing well on Wellbutrin , she wonders if I can take over prescribing this.  We are glad to do so  Will plan further follow- up pending labs. As above, Zaeda will reach out to her team with [facility] and see if there is some reason why they cannot now start a combined oral contraceptive pill.  I also encouraged some weight gain as increased body fat will probably help with  regular cycles Signed Harlene Schroeder, MD  Received labs as below, message to patient  Results for orders placed or performed in visit on 06/13/24  CBC   Collection Time: 06/13/24 10:52 AM  Result Value Ref Range   WBC 4.5 4.0 - 10.5 K/uL   RBC 4.23 3.87 - 5.11 Mil/uL   Platelets 216.0 150.0 - 400.0 K/uL   Hemoglobin 13.1 12.0 - 15.0 g/dL   HCT 60.5 63.9 - 53.9 %   MCV 93.2 78.0 - 100.0 fl   MCHC 33.3 30.0 - 36.0 g/dL   RDW 87.2 88.4 - 84.4 %  Comprehensive metabolic panel with GFR   Collection Time: 06/13/24 10:52 AM  Result Value Ref Range   Sodium 139 135 - 145 mEq/L   Potassium 4.2 3.5 - 5.1 mEq/L   Chloride 105 96 - 112 mEq/L   CO2 26 19 - 32 mEq/L   Glucose, Bld 83 70 - 99 mg/dL   BUN 18 6 - 23 mg/dL   Creatinine, Ser 8.95 0.40 - 1.20 mg/dL   Total Bilirubin 0.5 0.2 - 1.2 mg/dL   Alkaline Phosphatase 69 39 - 117 U/L   AST 15 0 - 37 U/L   ALT 11 0 - 35 U/L   Total Protein 7.0 6.0 - 8.3 g/dL   Albumin 4.4 3.5 - 5.2 g/dL   GFR 32.58 >39.99 mL/min   Calcium 9.2 8.4 - 10.5 mg/dL  Hemoglobin J8r   Collection Time: 06/13/24 10:52 AM  Result Value Ref Range   Hgb A1c MFr Bld 5.4 4.6 - 6.5 %  Lipid panel   Collection Time: 06/13/24 10:52 AM   Result Value Ref Range   Cholesterol 130 0 - 200 mg/dL   Triglycerides 34.9 0.0 - 149.0 mg/dL   HDL 36.59 >60.99 mg/dL   VLDL 86.9 0.0 - 59.9 mg/dL   LDL Cholesterol 54 0 - 99 mg/dL   Total CHOL/HDL Ratio 2    NonHDL 66.97   TSH   Collection Time: 06/13/24 10:52 AM  Result Value Ref Range   TSH 0.82 0.35 - 5.50 uIU/mL  Vitamin D  (25 hydroxy)   Collection Time: 06/13/24 10:52 AM  Result Value Ref Range   VITD 47.54 30.00 - 100.00 ng/mL

## 2024-06-08 NOTE — Patient Instructions (Addendum)
 It was great to see you again today, I will be in touch with your labs soon as possible Please ask your fertility people at [facility] when you can safely go back on a combined (estrogen and progesterone ) OCP.  A combined pill will likely better control your menstrual cycle and associated symptoms  We can update your pap next year Colon cancer screening in 5 years  Flu shot this fall  Take care!

## 2024-06-13 ENCOUNTER — Encounter: Payer: Self-pay | Admitting: Family Medicine

## 2024-06-13 ENCOUNTER — Ambulatory Visit: Admitting: Family Medicine

## 2024-06-13 VITALS — BP 112/68 | HR 88 | Ht 70.0 in | Wt 136.0 lb

## 2024-06-13 DIAGNOSIS — Z13220 Encounter for screening for lipoid disorders: Secondary | ICD-10-CM | POA: Diagnosis not present

## 2024-06-13 DIAGNOSIS — F3281 Premenstrual dysphoric disorder: Secondary | ICD-10-CM

## 2024-06-13 DIAGNOSIS — Z131 Encounter for screening for diabetes mellitus: Secondary | ICD-10-CM | POA: Diagnosis not present

## 2024-06-13 DIAGNOSIS — Z0000 Encounter for general adult medical examination without abnormal findings: Secondary | ICD-10-CM | POA: Diagnosis not present

## 2024-06-13 DIAGNOSIS — Z130 Encounter for screening for diseases of the blood and blood-forming organs and certain disorders involving the immune mechanism: Secondary | ICD-10-CM

## 2024-06-13 DIAGNOSIS — Z1329 Encounter for screening for other suspected endocrine disorder: Secondary | ICD-10-CM | POA: Diagnosis not present

## 2024-06-13 DIAGNOSIS — E559 Vitamin D deficiency, unspecified: Secondary | ICD-10-CM | POA: Diagnosis not present

## 2024-06-13 LAB — COMPREHENSIVE METABOLIC PANEL WITH GFR
ALT: 11 U/L (ref 0–35)
AST: 15 U/L (ref 0–37)
Albumin: 4.4 g/dL (ref 3.5–5.2)
Alkaline Phosphatase: 69 U/L (ref 39–117)
BUN: 18 mg/dL (ref 6–23)
CO2: 26 meq/L (ref 19–32)
Calcium: 9.2 mg/dL (ref 8.4–10.5)
Chloride: 105 meq/L (ref 96–112)
Creatinine, Ser: 1.04 mg/dL (ref 0.40–1.20)
GFR: 67.41 mL/min (ref >60.00)
Glucose, Bld: 83 mg/dL (ref 70–99)
Potassium: 4.2 meq/L (ref 3.5–5.1)
Sodium: 139 meq/L (ref 135–145)
Total Bilirubin: 0.5 mg/dL (ref 0.2–1.2)
Total Protein: 7 g/dL (ref 6.0–8.3)

## 2024-06-13 LAB — CBC
HCT: 39.4 % (ref 36.0–46.0)
Hemoglobin: 13.1 g/dL (ref 12.0–15.0)
MCHC: 33.3 g/dL (ref 30.0–36.0)
MCV: 93.2 fl (ref 78.0–100.0)
Platelets: 216 K/uL (ref 150.0–400.0)
RBC: 4.23 Mil/uL (ref 3.87–5.11)
RDW: 12.7 % (ref 11.5–15.5)
WBC: 4.5 K/uL (ref 4.0–10.5)

## 2024-06-13 LAB — VITAMIN D 25 HYDROXY (VIT D DEFICIENCY, FRACTURES): VITD: 47.54 ng/mL (ref 30.00–100.00)

## 2024-06-13 LAB — LIPID PANEL
Cholesterol: 130 mg/dL (ref 0–200)
HDL: 63.4 mg/dL (ref >39.00)
LDL Cholesterol: 54 mg/dL (ref 0–99)
NonHDL: 66.97
Total CHOL/HDL Ratio: 2
Triglycerides: 65 mg/dL (ref 0.0–149.0)
VLDL: 13 mg/dL (ref 0.0–40.0)

## 2024-06-13 LAB — TSH: TSH: 0.82 u[IU]/mL (ref 0.35–5.50)

## 2024-06-13 LAB — HEMOGLOBIN A1C: Hgb A1c MFr Bld: 5.4 % (ref 4.6–6.5)

## 2024-06-13 MED ORDER — BUPROPION HCL ER (XL) 150 MG PO TB24: 450.0000 mg | ORAL_TABLET | Freq: Every day | ORAL | 3 refills | Status: AC

## 2024-06-14 DIAGNOSIS — M545 Low back pain, unspecified: Secondary | ICD-10-CM | POA: Diagnosis not present

## 2024-06-20 DIAGNOSIS — Z713 Dietary counseling and surveillance: Secondary | ICD-10-CM | POA: Diagnosis not present

## 2024-06-21 DIAGNOSIS — M545 Low back pain, unspecified: Secondary | ICD-10-CM | POA: Diagnosis not present

## 2024-06-27 DIAGNOSIS — Z713 Dietary counseling and surveillance: Secondary | ICD-10-CM | POA: Diagnosis not present

## 2024-06-28 DIAGNOSIS — M545 Low back pain, unspecified: Secondary | ICD-10-CM | POA: Diagnosis not present

## 2024-08-03 ENCOUNTER — Ambulatory Visit: Admitting: Physician Assistant

## 2024-11-17 ENCOUNTER — Encounter: Payer: Self-pay | Admitting: Emergency Medicine

## 2024-11-17 ENCOUNTER — Ambulatory Visit (HOSPITAL_BASED_OUTPATIENT_CLINIC_OR_DEPARTMENT_OTHER)
Admission: RE | Admit: 2024-11-17 | Discharge: 2024-11-17 | Disposition: A | Discharge status: Home / Self Care | Payer: Self-pay | Source: Ambulatory Visit | Attending: Emergency Medicine | Admitting: Emergency Medicine

## 2024-11-17 ENCOUNTER — Other Ambulatory Visit (HOSPITAL_BASED_OUTPATIENT_CLINIC_OR_DEPARTMENT_OTHER): Payer: Self-pay | Admitting: Emergency Medicine

## 2024-11-17 ENCOUNTER — Other Ambulatory Visit: Payer: Self-pay

## 2024-11-17 DIAGNOSIS — M79662 Pain in left lower leg: Secondary | ICD-10-CM | POA: Diagnosis present

## 2024-11-17 DIAGNOSIS — S8992XA Unspecified injury of left lower leg, initial encounter: Secondary | ICD-10-CM

## 2025-05-31 ENCOUNTER — Ambulatory Visit: Admitting: Dermatology
